# Patient Record
Sex: Female | Born: 2000
Health system: Southern US, Community
[De-identification: ages and names within clinical notes are randomized; demographics above are authoritative.]

## PROBLEM LIST (undated history)

## (undated) DIAGNOSIS — Z8489 Family history of other specified conditions: Secondary | ICD-10-CM

## (undated) DIAGNOSIS — J353 Hypertrophy of tonsils with hypertrophy of adenoids: Secondary | ICD-10-CM

## (undated) HISTORY — PX: NO PAST SURGERIES: SHX2092

---
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
## 2020-05-20 ENCOUNTER — Other Ambulatory Visit: Payer: Self-pay

## 2020-05-20 ENCOUNTER — Telehealth: Payer: Self-pay | Admitting: *Deleted

## 2020-05-20 ENCOUNTER — Telehealth (INDEPENDENT_AMBULATORY_CARE_PROVIDER_SITE_OTHER): Payer: 59 | Admitting: Family Medicine

## 2020-05-20 ENCOUNTER — Encounter: Payer: Self-pay | Admitting: Family Medicine

## 2020-05-20 DIAGNOSIS — J029 Acute pharyngitis, unspecified: Secondary | ICD-10-CM

## 2020-05-20 DIAGNOSIS — J040 Acute laryngitis: Secondary | ICD-10-CM

## 2020-05-20 NOTE — Progress Notes (Signed)
   Patient ID: Brandy [name], female    DOB: [birth year], 19 y.o.   MRN: [id number]   Virtual Visit via Video Note  I connected with Brandy [name] on 05/20/20 at  4:10 PM EDT by a video enabled telemedicine application and verified that I am speaking with the correct person using two identifiers.  Location: Patient: home ([place], [place]) Provider: office   I discussed the limitations of evaluation and management by telemedicine and the availability of in person appointments. The patient expressed understanding and agreed to proceed.  Chief Complaint  Patient presents with  . Sore Throat    for 3 days   Subjective:    HPI  Having throat pain/hoarseness. Had both covid vaccines- early May. Pt is away at school in [place], [place] at [employer] and calling from there for this visit. Works at [employer].  Sore throat for past 3 days. Had lost her voice the first few days. No runny nose, congestion, coughing, ear pain, sinus pain or fever.   No red or white spots on tonsils.  Hasn't been sick for a while. No contact with known covid contacts. Hasn't taken any otc meds for this. Pt had strep in past, and doesn't think this is strep throat feeling.   Medical History Brandy [name] has a past medical history of Family history of adverse reaction to anesthesia and Tonsillar and adenoid hypertrophy (11/2018).   Outpatient Encounter Medications as of 05/20/2020  Medication Sig  . metFORMIN (GLUCOPHAGE XR) 500 MG 24 hr tablet Take one tablet po daily (Patient not taking: Reported on 05/20/2020)  . metFORMIN (GLUCOPHAGE) 500 MG tablet 1 qd (Patient not taking: Reported on 05/20/2020)  . topiramate (TOPAMAX) 50 MG tablet One tablet at bedtime (Patient not taking: Reported on 05/20/2020)   No facility-administered encounter medications on file as of 05/20/2020.     Review of Systems  Constitutional: Negative for chills and fever.  HENT: Positive for sore throat and voice change. Negative for  congestion, ear pain, rhinorrhea, sinus pressure and sinus pain.   Eyes: Negative for pain, discharge and itching.  Respiratory: Negative for cough.   Gastrointestinal: Negative for constipation, diarrhea, nausea and vomiting.    Vitals There were no vitals taken for this visit.  Objective:   Physical Exam  No PE due to phone visit.  Assessment and Plan   1. Laryngitis  2. Pharyngitis, unspecified etiology   Discussed viral uri vs. Bacterial sore throat. Pt is out of town at college and unable to come to clinic for throat swab.  Denies symptoms of strep throat or fever. Advising to try symptomatic tx.  Advising salt water gargles, chloraseptic spray, tylenol/ibuprofen prn. Call office if not improving in next 3-5 days.   Pt in agreement.  F/u prn.   Follow Up Instructions:    I discussed the assessment and treatment plan with the patient. The patient was provided an opportunity to ask questions and all were answered. The patient agreed with the plan and demonstrated an understanding of the instructions.   The patient was advised to call back or seek an in-person evaluation if the symptoms worsen or if the condition fails to improve as anticipated.  I provided 15 minutes of non-face-to-face time during this encounter.

## 2020-05-20 NOTE — Telephone Encounter (Signed)
Ms. Brandy [name], Brandy [name] are scheduled for a virtual visit with your provider today.    Just as we do with appointments in the office, we must obtain your consent to participate.  Your consent will be active for this visit and any virtual visit you may have with one of our providers in the next 365 days.    If you have a MyChart account, I can also send a copy of this consent to you electronically.  All virtual visits are billed to your insurance company just like a traditional visit in the office.  As this is a virtual visit, video technology does not allow for your provider to perform a traditional examination.  This may limit your provider's ability to fully assess your condition.  If your provider identifies any concerns that need to be evaluated in person or the need to arrange testing such as labs, EKG, etc, we will make arrangements to do so.    Although advances in technology are sophisticated, we cannot ensure that it will always work on either your end or our end.  If the connection with a video visit is poor, we may have to switch to a telephone visit.  With either a video or telephone visit, we are not always able to ensure that we have a secure connection.   I need to obtain your verbal consent now.   Are you willing to proceed with your visit today?   Brandy [name] has provided verbal consent on 05/20/2020 for a virtual visit (video or telephone).   Kathleen Lime, RN 05/20/2020  2:27 PM

## 2020-07-30 ENCOUNTER — Other Ambulatory Visit: Payer: Self-pay

## 2020-07-30 ENCOUNTER — Ambulatory Visit: Payer: 59 | Admitting: Nurse Practitioner

## 2020-07-30 VITALS — BP 122/72 | Temp 97.8°F | Wt 199.8 lb

## 2020-07-30 DIAGNOSIS — F329 Major depressive disorder, single episode, unspecified: Secondary | ICD-10-CM

## 2020-07-30 DIAGNOSIS — E282 Polycystic ovarian syndrome: Secondary | ICD-10-CM

## 2020-07-30 DIAGNOSIS — R7302 Impaired glucose tolerance (oral): Secondary | ICD-10-CM | POA: Diagnosis not present

## 2020-07-30 DIAGNOSIS — F32A Depression, unspecified: Secondary | ICD-10-CM

## 2020-07-30 DIAGNOSIS — F419 Anxiety disorder, unspecified: Secondary | ICD-10-CM | POA: Diagnosis not present

## 2020-07-30 LAB — POCT GLYCOSYLATED HEMOGLOBIN (HGB A1C): Hemoglobin A1C: 5.1 % (ref 4.0–5.6)

## 2020-07-30 MED ORDER — ESCITALOPRAM OXALATE 10 MG PO TABS: 10.0000 mg | ORAL_TABLET | Freq: Every day | ORAL | 0 refills | Status: DC

## 2020-07-30 NOTE — Progress Notes (Signed)
   Subjective:    Patient ID: Brandy [name], female    DOB: [birth year], 19 y.o.   MRN: [id number]  HPI  Patient arrives for a follow up on medications. Patient states she stopped her Glucophage due to GI side effects. Stopped Topamax due to changes is taste, particularly carbonated sodas.  Experiencing increased stress which has caused anxiety and depression symptoms.  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 Active lifestyle. Lives in [place] where she goes to college. Had her IUD removed. Back on Depo Provera since March. Followed by [facility].  Currently on Doxycycline 100 mg daily per dermatology. Depression screen [facility] 2/9 07/30/2020 08/26/2018  Decreased Interest 3 1  Down, Depressed, Hopeless 3 1  PHQ - 2 Score 6 2  Altered sleeping 3 3  Tired, decreased energy 3 3  Change in appetite 3 1  Feeling bad or failure about yourself  2 2  Trouble concentrating 1 1  Moving slowly or fidgety/restless 0 0  Suicidal thoughts 0 0  PHQ-9 Score 18 12  Difficult doing work/chores Very difficult Somewhat difficult   GAD 7 : Generalized Anxiety Score 07/30/2020  Nervous, Anxious, on Edge 3  Control/stop worrying 3  Worry too much - different things 3  Trouble relaxing 3  Restless 1  Easily annoyed or irritable 3  Afraid - awful might happen 3  Total GAD 7 Score 19  Anxiety Difficulty Very difficult    Denies suicidal or homicidal thoughts or ideation.     Review of Systems     Objective:   Physical Exam NAD. Alert, oriented. Lungs clear. Heart RRR.  Today's Vitals   07/30/20 1337  BP: 122/72  Temp: 97.8 F (36.6 C)  TempSrc: Oral  Weight: 199 lb 12.8 oz (90.6 kg)   Body mass index is 41.76 kg/m.  Results for orders placed or performed in visit on 07/30/20  POCT glycosylated hemoglobin (Hb A1C)  Result Value Ref Range   Hemoglobin A1C 5.1 4.0 - 5.6 %   HbA1c POC (<> result, manual entry)      HbA1c, POC (prediabetic range)     HbA1c, POC (controlled diabetic range)         Assessment & Plan:   Problem List Items Addressed This Visit      Endocrine   PCOS (polycystic ovarian syndrome) - Primary   Relevant Orders   POCT glycosylated hemoglobin (Hb A1C) (Completed)     Other   Anxiety and depression   Relevant Medications   escitalopram (LEXAPRO) 10 MG tablet   Morbid obesity (HCC)    Other Visit Diagnoses    Impaired glucose tolerance       Relevant Orders   POCT glycosylated hemoglobin (Hb A1C) (Completed)     Meds ordered this encounter  Medications  . escitalopram (LEXAPRO) 10 MG tablet    Sig: Take 1 tablet (10 mg total) by mouth daily.    Dispense:  30 tablet    Refill:  0    Order Specific Question:   Supervising Provider    Answer:   Lilyan Punt A [[id number]]   Start Lexapro as directed. Reviewed potential side effects. DC med and contact office if any problems.  Discussed importance of healthy diet and weight loss related to PCOS.  Return in about 1 month (around 08/30/2020) for anxiety/stress follow up; virtual is fine.

## 2020-07-31 ENCOUNTER — Encounter: Payer: Self-pay | Admitting: Nurse Practitioner

## 2020-08-19 DIAGNOSIS — Z20822 Contact with and (suspected) exposure to covid-19: Secondary | ICD-10-CM | POA: Diagnosis not present

## 2020-08-19 DIAGNOSIS — G43909 Migraine, unspecified, not intractable, without status migrainosus: Secondary | ICD-10-CM | POA: Diagnosis not present

## 2020-08-21 ENCOUNTER — Other Ambulatory Visit: Payer: Self-pay | Admitting: Nurse Practitioner

## 2020-08-30 DIAGNOSIS — Z3042 Encounter for surveillance of injectable contraceptive: Secondary | ICD-10-CM | POA: Diagnosis not present

## 2020-09-03 ENCOUNTER — Telehealth (INDEPENDENT_AMBULATORY_CARE_PROVIDER_SITE_OTHER): Payer: 59 | Admitting: Nurse Practitioner

## 2020-09-03 ENCOUNTER — Encounter: Payer: Self-pay | Admitting: Nurse Practitioner

## 2020-09-03 DIAGNOSIS — F329 Major depressive disorder, single episode, unspecified: Secondary | ICD-10-CM | POA: Diagnosis not present

## 2020-09-03 DIAGNOSIS — F32A Depression, unspecified: Secondary | ICD-10-CM

## 2020-09-03 DIAGNOSIS — F419 Anxiety disorder, unspecified: Secondary | ICD-10-CM | POA: Diagnosis not present

## 2020-09-03 DIAGNOSIS — G43009 Migraine without aura, not intractable, without status migrainosus: Secondary | ICD-10-CM

## 2020-09-03 MED ORDER — SERTRALINE HCL 50 MG PO TABS: ORAL_TABLET | ORAL | 0 refills | Status: DC

## 2020-09-03 NOTE — Progress Notes (Signed)
Patient ID: Brandy [name], female    DOB: 12-Aug-2001, 19 y.o.   MRN: [id number]  HPIFollow up on anxiety and depression. Having some side effects on lexapro.   Virtual Visit via Video Note  I connected with Brandy [name] on 09/03/20 at  1:40 PM EDT by a video enabled telemedicine application and verified that I am speaking with the correct person using two identifiers.  Location: Patient: home Provider: office   I discussed the limitations of evaluation and management by telemedicine and the availability of in person appointments. The patient expressed understanding and agreed to proceed.  History of Present Illness: She is complaining of headaches and migraines. She previously took Topamax and had stopped since her migraines were well controlled. Since starting Lexapro, she experienced increased frequency in headache occurrence. September 2nd, pt had a migraine that was unable to be controlled with OTC medications. She was taken to the ER by a friend where they treated her with the migraine cocktail. Patient stopped taking Lexapro 2 days ago, and has not experienced any headaches since. No change in her migraine symptomatology just more intense and would not go away while on Lexapro.    Subjective:: Review of Systems  Cardiovascular: Negative for chest pain and palpitations.  Neurological: Positive for headaches. Negative for syncope and light-headedness.       Headaches and migraines started when she began taking Lexapro. Had a migraine on Sept. 2nd resulting in an ER visit.   Psychiatric/Behavioral: Negative for agitation, confusion, decreased concentration, dysphoric mood, self-injury, sleep disturbance and suicidal ideas. The patient is not hyperactive.        Patient reports Lexapro working well for her, outside of headaches.       Objective:   Physical Exam Constitutional:      Appearance: Normal appearance. She is well-groomed.  Neurological:     Mental Status: She is  alert.  Psychiatric:        Attention and Perception: Attention normal.        Mood and Affect: Mood and affect normal.        Speech: Speech normal.        Behavior: Behavior normal.        Thought Content: Thought content normal.     Assessment and Plan:  Problem List Items Addressed This Visit      Cardiovascular and Mediastinum   Migraine without aura and without status migrainosus, not intractable - Primary   Relevant Medications   sertraline (ZOLOFT) 50 MG tablet     Other   Anxiety and depression   Relevant Medications   sertraline (ZOLOFT) 50 MG tablet      Informed patient to stop the lexapro and start taking Zoloft. Reviewed potential adverse effects including worsening anxiety, depression or suicidal thoughts.  Informed patient that if she experiences increased frequency or intensity in headaches or any adverse effects to stop taking Zoloft and notify the provider.   Meds ordered this encounter  Medications  . sertraline (ZOLOFT) 50 MG tablet    Sig: Take 1/2 tab PO QD x 6 days, then 1 tab PO QD    Dispense:  30 tablet    Refill:  0    Order Specific Question:   Supervising Provider    Answer:   Lilyan Punt A [[id number]]    Follow Up Instructions: I discussed the assessment and treatment plan with the patient. The patient was provided an opportunity to ask questions and all were answered.  The patient agreed with the plan and demonstrated an understanding of the instructions. The patient was advised to call back or seek an in-person evaluation if the symptoms worsen or if the condition fails to improve as anticipated Return in about 1 month (around 10/03/2020) for depression/anxiety. Call back sooner if needed.  .  I provided 15 minutes of non-face-to-face time during this encounter.

## 2020-09-04 ENCOUNTER — Encounter: Payer: Self-pay | Admitting: Nurse Practitioner

## 2020-09-04 DIAGNOSIS — G43009 Migraine without aura, not intractable, without status migrainosus: Secondary | ICD-10-CM | POA: Insufficient documentation

## 2020-09-04 NOTE — Progress Notes (Signed)
   Subjective:    Patient ID: Brandy [name], female    DOB: [birth year], 19 y.o.   MRN: [id number]  HPI    Review of Systems     Objective:   Physical Exam        Assessment & Plan:

## 2020-09-13 ENCOUNTER — Encounter: Payer: Self-pay | Admitting: Nurse Practitioner

## 2020-09-13 NOTE — Telephone Encounter (Signed)
Nurses So in these type of situations it can often take a good 21 days to see some improvement with antidepressants.  Counseling is extremely important to help with anxiety and depression.  We highly recommended.  If the patient does not have counseling already please help set her up for this.  If she is tolerating the medicine well and not having any significant side effects with the medicine we can increase the dose of the Zoloft to 100 mg 1 daily if the patient is interested in doing so  Also the patient should have a follow-up office visit within 2 to 3 weeks this can be done with myself or with Dorena Bodo nurse practitioner or with Carolyn-please go ahead and work on setting this up as well

## 2020-09-13 NOTE — Telephone Encounter (Signed)
Nurses I would recommend Zoloft 100 mg 1 daily, #30, 2 refills  I also recommend a phone or video follow-up in 2 to 3 weeks to see how things are going-this can be with myself with Clydie Braun or with [name] Also recommend that she notify us should things get worse specifically if she should start feeling suicidal or severely depressed she would need to help right away through urgent care or ER or here thank you

## 2020-09-14 MED ORDER — SERTRALINE HCL 100 MG PO TABS: 100.0000 mg | ORAL_TABLET | Freq: Every day | ORAL | 2 refills | Status: DC

## 2020-09-14 NOTE — Addendum Note (Signed)
Addended by: Marlowe Shores on: 09/14/2020 09:14 AM   Modules accepted: Orders

## 2020-09-28 ENCOUNTER — Other Ambulatory Visit: Payer: Self-pay | Admitting: Nurse Practitioner

## 2020-10-01 ENCOUNTER — Ambulatory Visit (INDEPENDENT_AMBULATORY_CARE_PROVIDER_SITE_OTHER): Payer: 59 | Admitting: Nurse Practitioner

## 2020-10-01 ENCOUNTER — Encounter: Payer: Self-pay | Admitting: Nurse Practitioner

## 2020-10-01 VITALS — BP 114/72 | Temp 97.6°F | Wt 201.2 lb

## 2020-10-01 DIAGNOSIS — G43009 Migraine without aura, not intractable, without status migrainosus: Secondary | ICD-10-CM | POA: Diagnosis not present

## 2020-10-01 DIAGNOSIS — F419 Anxiety disorder, unspecified: Secondary | ICD-10-CM | POA: Diagnosis not present

## 2020-10-01 DIAGNOSIS — F32A Depression, unspecified: Secondary | ICD-10-CM | POA: Diagnosis not present

## 2020-10-01 MED ORDER — VENLAFAXINE HCL ER 37.5 MG PO CP24: ORAL_CAPSULE | ORAL | 0 refills | Status: DC

## 2020-10-01 NOTE — Patient Instructions (Addendum)
Take 50 mg of the Zoloft for a couple days, then start taking the Effexor. Do not stop abruptly, this can cause increased agitation or mood fluctuation. If symptoms do not improve or worsen, please let us know.

## 2020-10-01 NOTE — Progress Notes (Signed)
Subjective:    Patient ID: Brandy [name], female    DOB: 10/25/2001, 19 y.o.   MRN: [id number]  HPI Pt here for medication follow up. Pt is on Zoloft 100 mg but states its not working. Pt is having migraine every day. Since started anxiety/depression med, she has noticed migraines daily.  Stopped taking Topamax, Headaches were improved until recently trying antidepressants. This is her second medication she has tried. Anxiety and depression have worsened with new dose.   Review of Systems  Constitutional: Positive for activity change, appetite change and fatigue. Negative for chills, diaphoresis and fever.       She has been staying in bed, missing class, and struggles to go to work.   Respiratory: Positive for chest tightness. Negative for shortness of breath.   Cardiovascular: Negative for chest pain and palpitations.  Neurological: Positive for light-headedness and headaches. Negative for syncope and weakness.       Headaches were improved until recently trying antidepressants. This is her second medication she has tried. HA's are worse than before, described as a constant pounding pain.  Psychiatric/Behavioral: Positive for agitation, decreased concentration, dysphoric mood and sleep disturbance. Negative for self-injury and suicidal ideas. The patient is nervous/anxious. The patient is not hyperactive.    Depression screen [facility] 2/9 10/01/2020 09/03/2020 07/30/2020 08/26/2018  Decreased Interest 3 3 3 1   Down, Depressed, Hopeless 3 2 3 1   PHQ - 2 Score 6 5 6 2   Altered sleeping 3 3 3 3   Tired, decreased energy 3 3 3 3   Change in appetite 3 3 3 1   Feeling bad or failure about yourself  3 3 2 2   Trouble concentrating 2 1 1 1   Moving slowly or fidgety/restless 1 0 0 0  Suicidal thoughts 0 0 0 0  PHQ-9 Score 21 18 18 12   Difficult doing work/chores Extremely dIfficult Somewhat difficult Very difficult Somewhat difficult        GAD 7 : Generalized Anxiety Score 10/01/2020 09/03/2020  07/30/2020  Nervous, Anxious, on Edge 3 3 3   Control/stop worrying 3 3 3   Worry too much - different things 3 3 3   Trouble relaxing 3 1 3   Restless 2 0 1  Easily annoyed or irritable 3 3 3   Afraid - awful might happen 3 0 3  Total GAD 7 Score 20 13 19   Anxiety Difficulty Extremely difficult Somewhat difficult Very difficult     Objective:   Physical Exam Constitutional:      Appearance: She is well-developed and well-groomed.     Comments: Pt is wearing sunglasses due to constant headaches/migraines.  Cardiovascular:     Rate and Rhythm: Normal rate and regular rhythm.     Pulses: Normal pulses.     Heart sounds: Normal heart sounds. No murmur heard.   Pulmonary:     Effort: Pulmonary effort is normal.     Breath sounds: Normal breath sounds. No wheezing.  Neurological:     Mental Status: She is alert.  Psychiatric:        Attention and Perception: Attention normal.        Mood and Affect: Mood normal.        Speech: Speech normal.        Behavior: Behavior normal. Behavior is cooperative.        Thought Content: Thought content normal. Thought content does not include homicidal or suicidal ideation. Thought content does not include homicidal or suicidal plan.  Judgment: Judgment normal.   Making good eye contact. Cheerful, calm affect. Dressed appropriately.  Today's Vitals   10/01/20 1503  BP: 114/72  Temp: 97.6 F (36.4 C)  Weight: 91.3 kg   Body mass index is 42.05 kg/m.     Assessment & Plan:   Problem List Items Addressed This Visit      Cardiovascular and Mediastinum   Migraine without aura and without status migrainosus, not intractable   Relevant Medications   venlafaxine XR (EFFEXOR XR) 37.5 MG 24 hr capsule     Other   Anxiety and depression - Primary   Relevant Medications   venlafaxine XR (EFFEXOR XR) 37.5 MG 24 hr capsule      Meds ordered this encounter  Medications  . venlafaxine XR (EFFEXOR XR) 37.5 MG 24 hr capsule    Sig: Take  1 capsule PO QD for depression and anxiety    Dispense:  30 capsule    Refill:  0    Take 50 mg of the Zoloft for a couple days, DC then start taking the Effexor. Do not stop abruptly, this can cause increased agitation or mood fluctuation.  BBW: If you experience thoughts of harming yourself or others stop the medication and seek immediate medical attention. If symptoms do not improve or worsen with new medication, please let us know. Defers psychiatry or counseling referral at this time. Has access to students health at [employer].  Effexor prescribed to help mental health issues and migraines.   Return in about 1 month (around 11/01/2020) for follow up on new med.Marland Kitchen

## 2020-10-03 ENCOUNTER — Encounter: Payer: Self-pay | Admitting: Nurse Practitioner

## 2020-10-03 ENCOUNTER — Encounter: Payer: Self-pay | Admitting: Family Medicine

## 2020-10-03 NOTE — Progress Notes (Signed)
   Subjective:    Patient ID: Brandy [name], female    DOB: 01/04/2001, 19 y.o.   MRN: [id number]  HPI    Review of Systems     Objective:   Physical Exam        Assessment & Plan:   

## 2020-10-04 ENCOUNTER — Telehealth: Payer: Self-pay

## 2020-10-04 NOTE — Telephone Encounter (Signed)
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  It could be a mild virus.  Less likely to be food poisoning. Covid can sometimes cause these type of symptoms, it would be wise for the patient to be seen.  If she needs anything else from [place] please let me know thank you-Dr. Lorin Picket

## 2020-10-05 ENCOUNTER — Telehealth (INDEPENDENT_AMBULATORY_CARE_PROVIDER_SITE_OTHER): Payer: 59 | Admitting: Family Medicine

## 2020-10-05 ENCOUNTER — Encounter: Payer: Self-pay | Admitting: Family Medicine

## 2020-10-05 ENCOUNTER — Other Ambulatory Visit: Payer: Self-pay

## 2020-10-05 DIAGNOSIS — R197 Diarrhea, unspecified: Secondary | ICD-10-CM | POA: Diagnosis not present

## 2020-10-05 MED ORDER — DIPHENOXYLATE-ATROPINE 2.5-0.025 MG PO TABS: 1.0000 | ORAL_TABLET | Freq: Four times a day (QID) | ORAL | 0 refills | Status: DC | PRN

## 2020-10-05 NOTE — Progress Notes (Signed)
   Patient ID: Brandy [name], female    DOB: [birth year], 19 y.o.   MRN: [id number]   Chief Complaint  Patient presents with  . Diarrhea    Patient reports having severe diarrhea and vomitting on Saturday. Has not vomitted since Sunday but is still having diarrhea frequently and unable to eat much but trying to drink plenty of fluids.    Subjective:  CC: diarrhea since Saturday  Brandy [name] is seen today via video visit.  Her symptoms started Saturday night at her sister's rehearsal dinner.  She was experiencing nausea, vomiting, and diarrhea.  She is also experienced a slight headache since then.  Pertinent negatives include no fever, no nausea since Sunday, no vomiting since Sunday, no congestion, no sore throat.  Today she is still experiencing liquid diarrhea, 6 times today.  She is able to keep water down without vomiting, and is alternating water and Gatorade.  Wishes to control the diarrhea.    Medical History Brandy [name] has a past medical history of Family history of adverse reaction to anesthesia and Tonsillar and adenoid hypertrophy (11/2018).   Outpatient Encounter Medications as of 10/05/2020  Medication Sig  . diphenoxylate-atropine (LOMOTIL) 2.5-0.025 MG tablet Take 1 tablet by mouth 4 (four) times daily as needed for diarrhea or loose stools.  . venlafaxine XR (EFFEXOR XR) 37.5 MG 24 hr capsule Take 1 capsule PO QD for depression and anxiety  . [DISCONTINUED] DOXYCYCLINE PO Take by mouth. Takes for acne   No facility-administered encounter medications on file as of 10/05/2020.     Review of Systems  Constitutional: Negative for chills and fever.  HENT: Negative for congestion.   Gastrointestinal: Positive for diarrhea, nausea and vomiting. Negative for abdominal pain.       No nausea and vomiting since Sunday.      Vitals There were no vitals taken for this visit.  Objective:   Physical Exam Vitals reviewed: unable to due video visit.    Unable due to video  visit  Assessment and Plan   1. Diarrhea of presumed infectious origin - diphenoxylate-atropine (LOMOTIL) 2.5-0.025 MG tablet; Take 1 tablet by mouth 4 (four) times daily as needed for diarrhea or loose stools.  Dispense: 15 tablet; Refill: 0   This is likely viral in nature.  Goal is to prevent dehydration, and the need to go to the emergency department for IV fluids.  She is able to keep fluids down, alternating water and Gatorade.  No fever, no nausea, and vomiting since Sunday.  Her symptoms have improved somewhat, except for the diarrhea.  Lomotil sent to pharmacy.  She will continue conservative treatment, paying close attention to her hydration status.  She attends school at [employer], is unable to travel back to [place] due to her symptoms, school note sent through my chart.  Agrees with plan of care discussed today. Understands warning signs to seek further care: Fever, nausea, vomiting,, diarrhea not improving with medication, worsening symptoms. Understands to follow-up if symptoms do not improve.

## 2020-10-08 NOTE — Telephone Encounter (Signed)
ERROR

## 2020-10-28 ENCOUNTER — Other Ambulatory Visit: Payer: Self-pay | Admitting: Nurse Practitioner

## 2020-11-17 ENCOUNTER — Encounter: Payer: Self-pay | Admitting: Family Medicine

## 2020-11-19 ENCOUNTER — Other Ambulatory Visit: Payer: Self-pay

## 2020-11-19 ENCOUNTER — Telehealth (INDEPENDENT_AMBULATORY_CARE_PROVIDER_SITE_OTHER): Payer: 59 | Admitting: Nurse Practitioner

## 2020-11-19 ENCOUNTER — Telehealth: Payer: Self-pay | Admitting: *Deleted

## 2020-11-19 DIAGNOSIS — F32A Depression, unspecified: Secondary | ICD-10-CM

## 2020-11-19 DIAGNOSIS — F419 Anxiety disorder, unspecified: Secondary | ICD-10-CM | POA: Diagnosis not present

## 2020-11-19 DIAGNOSIS — G43009 Migraine without aura, not intractable, without status migrainosus: Secondary | ICD-10-CM | POA: Diagnosis not present

## 2020-11-19 DIAGNOSIS — Z3042 Encounter for surveillance of injectable contraceptive: Secondary | ICD-10-CM | POA: Diagnosis not present

## 2020-11-19 MED ORDER — RIZATRIPTAN BENZOATE 10 MG PO TABS: 10.0000 mg | ORAL_TABLET | ORAL | 0 refills | Status: DC | PRN

## 2020-11-19 MED ORDER — VENLAFAXINE HCL 75 MG PO TABS: 75.0000 mg | ORAL_TABLET | Freq: Every day | ORAL | 0 refills | Status: DC

## 2020-11-19 NOTE — Progress Notes (Signed)
  PHONE VISIT  Patient ID: Brandy [name], female    DOB: [birth year], 19 y.o.   MRN: [id number]  Virtual Visit via Phone:  I connected with Brandy [name] on 11/19/20 at  3:40 PM EST by a phone and verified that I am speaking with the correct person using two identifiers.  Location: Patient: home Provider: office   I discussed the limitations of evaluation and management by telemedicine and the availability of in person appointments. The patient expressed understanding and agreed to proceed.  History of Present Illness: Patient arrives to discuss return of migraines.   Subjective: Patient states migraine started Tuesday with real bad headache and chills- sweating- no fever. Patient states she had stiffness in her neck and headache continued through Thursday. The chills and sweating are gone today but still has the headache and head feels heavy. Overall, the headache is less severe than before. She also reports having back pain throughout her middle to low back. Denies any urinary symtoms such as urgency, frequency, or hematuria.   Pt reports taking her temperature with the highest temp being 98.7. She denies getting any Covid testing done.    She reports stopping the Effexor 37.5 mg and that it was not helpful. No adverse effects. Her symptoms did not get worse, but explains that they were not improved with the new medication. Denies thoughts of hurting herself or others.   Observations/Objective: General: Patient is alert with coherent thought and judgement Exam limited due to telephone visit.   Assessment and Plan: Problem List Items Addressed This Visit      Cardiovascular and Mediastinum   Migraine without aura and without status migrainosus, not intractable     Other   Anxiety and depression - Primary     Encouraged patient to obtain a Covid test. Expect continued gradual resolution of her symptoms. If she experiences symptoms such as fever, worsening headache, stiff neck,  blurred vision, nausea/ vomiting or urinary symptoms to seek immediate medical attention.  Increase the Effexor dose to 75 mg PO daily. If worsening symptoms or thoughts of hurting yourself for others occur, stop the medicine and seek immediate medical attention.   Begin Maxalt for abortive migraine therapy. Informed the patient of instructions and of possible side effects of this medication. Do not exceed 2 tablets in a 24 hour period. Avoid overuse. Recommend keeping a headache diary through a free app.   Contact the office if you have and question or concerns regarding your new medication.   Follow Up Instructions:  Return in about 1 month (around 12/20/2020).   I discussed the assessment and treatment plan with the patient. The patient was provided an opportunity to ask questions and all were answered. The patient agreed with the plan and demonstrated an understanding of the instructions.   The patient was advised to call back or seek an in-person evaluation if the symptoms worsen or if the condition fails to improve as anticipated.  I provided 15 minutes of non-face-to-face time during this encounter.

## 2020-11-19 NOTE — Telephone Encounter (Signed)
[name], sawatzky are scheduled for a virtual visit with your provider today.    Just as we do with appointments in the office, we must obtain your consent to participate.  Your consent will be active for this visit and any virtual visit you may have with one of our providers in the next 365 days.    If you have a MyChart account, I can also send a copy of this consent to you electronically.  All virtual visits are billed to your insurance company just like a traditional visit in the office.  As this is a virtual visit, video technology does not allow for your provider to perform a traditional examination.  This may limit your provider's ability to fully assess your condition.  If your provider identifies any concerns that need to be evaluated in person or the need to arrange testing such as labs, EKG, etc, we will make arrangements to do so.    Although advances in technology are sophisticated, we cannot ensure that it will always work on either your end or our end.  If the connection with a video visit is poor, we may have to switch to a telephone visit.  With either a video or telephone visit, we are not always able to ensure that we have a secure connection.   I need to obtain your verbal consent now.   Are you willing to proceed with your visit today?   Brandy [name] has provided verbal consent on 11/19/2020 for a virtual visit (video or telephone).   Kathleen Lime, RN 11/19/2020  1:29 PM

## 2020-11-19 NOTE — Patient Instructions (Signed)
With the Maxalt, you may feel some tightness in the chest. This is a common side effect. Do not take this more than twice in a 24 hour period.

## 2020-11-20 ENCOUNTER — Encounter: Payer: Self-pay | Admitting: Nurse Practitioner

## 2020-11-20 NOTE — Progress Notes (Signed)
   Subjective:    Patient ID: Brandy [name], female    DOB: Feb 16, 2001, 19 y.o.   MRN: [id number]  HPI    Review of Systems     Objective:   Physical Exam        Assessment & Plan:

## 2020-12-09 ENCOUNTER — Other Ambulatory Visit: Payer: Self-pay | Admitting: Nurse Practitioner

## 2020-12-09 NOTE — Telephone Encounter (Signed)
Seen 12/3 for migraines

## 2020-12-12 ENCOUNTER — Other Ambulatory Visit: Payer: Self-pay | Admitting: Nurse Practitioner

## 2020-12-13 NOTE — Telephone Encounter (Signed)
Seen 10/01/20 for depression

## 2021-02-07 DIAGNOSIS — Z3042 Encounter for surveillance of injectable contraceptive: Secondary | ICD-10-CM | POA: Diagnosis not present

## 2021-03-23 ENCOUNTER — Telehealth: Payer: Self-pay

## 2021-03-23 NOTE — Telephone Encounter (Signed)
Pt wants a refill on dicyclomine (Bentyl) 10 mg pt wants this sent to [facility] [address]  Pt call back [phone]

## 2021-03-25 ENCOUNTER — Other Ambulatory Visit: Payer: Self-pay | Admitting: Nurse Practitioner

## 2021-03-25 MED ORDER — DICYCLOMINE HCL 10 MG PO CAPS: 10.0000 mg | ORAL_CAPSULE | Freq: Three times a day (TID) | ORAL | 2 refills | Status: AC

## 2021-03-25 NOTE — Telephone Encounter (Signed)
Done

## 2021-04-25 DIAGNOSIS — Z01419 Encounter for gynecological examination (general) (routine) without abnormal findings: Secondary | ICD-10-CM | POA: Diagnosis not present

## 2021-04-25 DIAGNOSIS — Z3042 Encounter for surveillance of injectable contraceptive: Secondary | ICD-10-CM | POA: Diagnosis not present

## 2021-04-25 DIAGNOSIS — E282 Polycystic ovarian syndrome: Secondary | ICD-10-CM | POA: Diagnosis not present

## 2021-04-25 DIAGNOSIS — Z3202 Encounter for pregnancy test, result negative: Secondary | ICD-10-CM | POA: Diagnosis not present

## 2021-06-10 DIAGNOSIS — Z0100 Encounter for examination of eyes and vision without abnormal findings: Secondary | ICD-10-CM | POA: Diagnosis not present

## 2021-07-13 DIAGNOSIS — Z3042 Encounter for surveillance of injectable contraceptive: Secondary | ICD-10-CM | POA: Diagnosis not present

## 2021-08-02 DIAGNOSIS — F411 Generalized anxiety disorder: Secondary | ICD-10-CM | POA: Diagnosis not present

## 2021-08-02 DIAGNOSIS — F341 Dysthymic disorder: Secondary | ICD-10-CM | POA: Diagnosis not present

## 2021-08-02 DIAGNOSIS — F332 Major depressive disorder, recurrent severe without psychotic features: Secondary | ICD-10-CM | POA: Diagnosis not present

## 2021-08-16 DIAGNOSIS — F332 Major depressive disorder, recurrent severe without psychotic features: Secondary | ICD-10-CM | POA: Diagnosis not present

## 2021-08-16 DIAGNOSIS — F411 Generalized anxiety disorder: Secondary | ICD-10-CM | POA: Diagnosis not present

## 2021-08-16 DIAGNOSIS — F341 Dysthymic disorder: Secondary | ICD-10-CM | POA: Diagnosis not present

## 2021-08-17 DIAGNOSIS — N390 Urinary tract infection, site not specified: Secondary | ICD-10-CM | POA: Diagnosis not present

## 2021-08-17 DIAGNOSIS — N3000 Acute cystitis without hematuria: Secondary | ICD-10-CM | POA: Diagnosis not present

## 2021-09-15 ENCOUNTER — Other Ambulatory Visit (HOSPITAL_COMMUNITY): Payer: Self-pay

## 2021-09-15 MED ORDER — BUPROPION HCL ER (XL) 150 MG PO TB24
150.0000 mg | ORAL_TABLET | Freq: Every morning | ORAL | 0 refills | Status: DC
  Filled 2021-09-15 – 2021-09-19 (×3): qty 30, 30d supply, fill #0

## 2021-09-19 ENCOUNTER — Other Ambulatory Visit (HOSPITAL_COMMUNITY): Payer: Self-pay

## 2021-09-20 DIAGNOSIS — F411 Generalized anxiety disorder: Secondary | ICD-10-CM | POA: Diagnosis not present

## 2021-09-20 DIAGNOSIS — F341 Dysthymic disorder: Secondary | ICD-10-CM | POA: Diagnosis not present

## 2021-09-20 DIAGNOSIS — F332 Major depressive disorder, recurrent severe without psychotic features: Secondary | ICD-10-CM | POA: Diagnosis not present

## 2021-09-27 DIAGNOSIS — Z3042 Encounter for surveillance of injectable contraceptive: Secondary | ICD-10-CM | POA: Diagnosis not present

## 2021-10-21 ENCOUNTER — Other Ambulatory Visit (HOSPITAL_COMMUNITY): Payer: Self-pay

## 2021-10-21 DIAGNOSIS — F341 Dysthymic disorder: Secondary | ICD-10-CM | POA: Diagnosis not present

## 2021-10-21 DIAGNOSIS — F332 Major depressive disorder, recurrent severe without psychotic features: Secondary | ICD-10-CM | POA: Diagnosis not present

## 2021-10-21 DIAGNOSIS — F411 Generalized anxiety disorder: Secondary | ICD-10-CM | POA: Diagnosis not present

## 2021-10-21 MED ORDER — SERTRALINE HCL 50 MG PO TABS
ORAL_TABLET | ORAL | 0 refills | Status: DC
  Filled 2021-10-21: qty 30, 30d supply, fill #0

## 2021-11-21 ENCOUNTER — Other Ambulatory Visit (HOSPITAL_COMMUNITY): Payer: Self-pay

## 2021-11-21 DIAGNOSIS — F341 Dysthymic disorder: Secondary | ICD-10-CM | POA: Diagnosis not present

## 2021-11-21 DIAGNOSIS — F411 Generalized anxiety disorder: Secondary | ICD-10-CM | POA: Diagnosis not present

## 2021-11-21 DIAGNOSIS — F332 Major depressive disorder, recurrent severe without psychotic features: Secondary | ICD-10-CM | POA: Diagnosis not present

## 2021-11-21 MED ORDER — HYDROXYZINE PAMOATE 25 MG PO CAPS
ORAL_CAPSULE | ORAL | 0 refills | Status: AC
  Filled 2021-11-21: qty 120, 30d supply, fill #0

## 2021-11-21 MED ORDER — SERTRALINE HCL 100 MG PO TABS
100.0000 mg | ORAL_TABLET | Freq: Every day | ORAL | 0 refills | Status: DC
  Filled 2021-11-21: qty 90, 90d supply, fill #0

## 2021-11-22 ENCOUNTER — Other Ambulatory Visit (HOSPITAL_COMMUNITY): Payer: Self-pay

## 2021-12-20 DIAGNOSIS — Z3042 Encounter for surveillance of injectable contraceptive: Secondary | ICD-10-CM | POA: Diagnosis not present

## 2021-12-20 DIAGNOSIS — F332 Major depressive disorder, recurrent severe without psychotic features: Secondary | ICD-10-CM | POA: Diagnosis not present

## 2021-12-20 DIAGNOSIS — F411 Generalized anxiety disorder: Secondary | ICD-10-CM | POA: Diagnosis not present

## 2021-12-20 DIAGNOSIS — F341 Dysthymic disorder: Secondary | ICD-10-CM | POA: Diagnosis not present

## 2022-02-03 ENCOUNTER — Ambulatory Visit (INDEPENDENT_AMBULATORY_CARE_PROVIDER_SITE_OTHER): Payer: 59 | Admitting: Nurse Practitioner

## 2022-02-03 ENCOUNTER — Other Ambulatory Visit: Payer: Self-pay

## 2022-02-03 ENCOUNTER — Other Ambulatory Visit (HOSPITAL_COMMUNITY): Payer: Self-pay

## 2022-02-03 ENCOUNTER — Encounter: Payer: Self-pay | Admitting: Nurse Practitioner

## 2022-02-03 ENCOUNTER — Ambulatory Visit (HOSPITAL_COMMUNITY)
Admission: RE | Admit: 2022-02-03 | Discharge: 2022-02-03 | Disposition: A | Discharge status: Home / Self Care | Payer: 59 | Source: Ambulatory Visit | Attending: Nurse Practitioner | Admitting: Nurse Practitioner

## 2022-02-03 ENCOUNTER — Telehealth: Payer: Self-pay | Admitting: Family Medicine

## 2022-02-03 VITALS — BP 123/86 | HR 99 | Temp 98.1°F | Ht <= 58 in | Wt 211.0 lb

## 2022-02-03 DIAGNOSIS — B9689 Other specified bacterial agents as the cause of diseases classified elsewhere: Secondary | ICD-10-CM

## 2022-02-03 DIAGNOSIS — E282 Polycystic ovarian syndrome: Secondary | ICD-10-CM

## 2022-02-03 DIAGNOSIS — G8929 Other chronic pain: Secondary | ICD-10-CM

## 2022-02-03 DIAGNOSIS — M545 Low back pain, unspecified: Secondary | ICD-10-CM | POA: Diagnosis not present

## 2022-02-03 DIAGNOSIS — J019 Acute sinusitis, unspecified: Secondary | ICD-10-CM

## 2022-02-03 MED ORDER — WEGOVY 0.25 MG/0.5ML ~~LOC~~ SOAJ
0.2500 mg | SUBCUTANEOUS | 0 refills | Status: DC
  Filled 2022-02-03 (×3): qty 2, 28d supply, fill #0

## 2022-02-03 MED ORDER — TIZANIDINE HCL 4 MG PO TABS: 4.0000 mg | ORAL_TABLET | Freq: Four times a day (QID) | ORAL | 0 refills | Status: AC | PRN

## 2022-02-03 MED ORDER — AZITHROMYCIN 250 MG PO TABS: ORAL_TABLET | ORAL | 0 refills | Status: DC

## 2022-02-03 MED ORDER — NAPROXEN 500 MG PO TABS: 500.0000 mg | ORAL_TABLET | Freq: Two times a day (BID) | ORAL | 0 refills | Status: DC

## 2022-02-03 NOTE — Progress Notes (Signed)
Subjective:    Patient ID: Brandy [name], female    DOB: [birth year], 21 y.o.   MRN: [id number]  HPI Lower Back pain chronic issue worsening over the years States lordosis was noted on a previous x-ray.  Has occurred off and on but has worsened lately which she relates to weight gain.  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  Gets massages every month for the past 2 years which will give her 5 to 6 days of relief and then pain will gradually come back.  Describes the pain is along the mid back area bilaterally with occasional shooting pain up to the mid back area near her bra line for the past month.  No change in bowel or bladder habits.  Pain does not radiate down her extremities.  No history of injury. Would like to discuss options to help her with weight.  Tried healthy diet and going to the gym on a regular basis but got discouraged and has not doing very well lately.  Plans to restart this but would like medication to help jumpstart her weight loss.  Denies any personal history of pancreatitis or cancer.  Denies any family history of thyroid or other endocrine cancers.  Was given metformin for her PCOS but unable to tolerate this due to persistent significant diarrhea. Has a history of anxiety and depression has an appointment in April in [place] where she lives to hopefully start counseling/therapy.  Denies suicidal homicidal thoughts or ideation. Also complaints of sinus symptoms over the past week.  Occasional cough.  Scratchy throat.  No fever.  Off-and-on ear pain.  Symptoms have worsened over the past couple of days.  Now producing yellow mucus all day.  Feels like it is going more into the upper chest area.  Sinus pressure.  No wheezing, chest pain or shortness of breath.  Depression screen PHQ 2/9 02/03/2022  Decreased Interest 2  Down, Depressed, Hopeless 2  PHQ - 2 Score 4  Altered sleeping 3  Tired, decreased energy 3  Change in appetite 2   Feeling bad or failure about yourself  1  Trouble concentrating 2  Moving slowly or fidgety/restless 0  Suicidal thoughts 0  PHQ-9 Score 15  Difficult doing work/chores Very difficult         Objective:   Physical Exam NAD.  Alert, oriented.  TMs mild clear effusion, no erythema.  Nasal mucosa pale and mildly boggy more on the left.  Pharynx injected with slightly discolored PND noted.  Neck supple with mild soft anterior adenopathy.  Lungs clear.  Heart regular rate rhythm.  Tenderness with palpation of the lumbar spine.  Generalized mild tenderness along the bilateral lumbar paraspinal area.  SLR negative bilateral.  Patellar reflexes normal.  Gait normal.  Normal ROM of the spine with minimal tenderness. Lumbar spine x-ray dated 06/23/2010 normal except for accentuated lumbar lordosis. Today's Vitals   02/03/22 0931  BP: 123/86  Pulse: 99  Temp: 98.1 F (36.7 C)  SpO2: 98%  Weight: 211 lb (95.7 kg)  Height: 4\' 10"  (1.473 m)   Body mass index is 44.1 kg/m.       Assessment & Plan:   Problem List Items Addressed This Visit       Endocrine   PCOS (polycystic ovarian syndrome)     Other   Morbid obesity ([place])   Other Visit Diagnoses     Chronic bilateral low back pain without sciatica    -  Primary   Relevant Medications   naproxen (NAPROSYN) 500 MG tablet   tiZANidine (ZANAFLEX) 4 MG tablet   Other Relevant Orders   DG Lumbar Spine 2-3 Views (Completed)   Acute bacterial rhinosinusitis       Relevant Medications   azithromycin (ZITHROMAX Z-PAK) 250 MG tablet       Meds ordered this encounter  Medications   naproxen (NAPROSYN) 500 MG tablet    Sig: Take 1 tablet (500 mg total) by mouth 2 (two) times daily with a meal. Prn back pain    Dispense:  30 tablet    Refill:  0    Order Specific Question:   Supervising Provider    Answer:   Sallee Lange A [[id number]]   tiZANidine (ZANAFLEX) 4 MG tablet    Sig: Take 1 tablet (4 mg total) by mouth every 6 (six) hours  as needed for muscle spasms.    Dispense:  30 tablet    Refill:  0    Order Specific Question:   Supervising Provider    Answer:   Sallee Lange A [[id number]]   azithromycin (ZITHROMAX Z-PAK) 250 MG tablet    Sig: Take 2 tablets (500 mg) on  Day 1,  followed by 1 tablet (250 mg) once daily on Days 2 through 5.    Dispense:  6 each    Refill:  0    Order Specific Question:   Supervising Provider    Answer:   Sallee Lange A [[id number]]   DISCONTD: Semaglutide-Weight Management (WEGOVY) 0.25 MG/0.5ML SOAJ    Sig: Inject 0.25 mg into the skin once a week.    Dispense:  2 mL    Refill:  0    Order Specific Question:   Supervising Provider    Answer:   Sallee Lange A [[id number]]   Recommend TENS unit, ice/heat applications, stretching exercises and continued massage therapy.  Naproxen as directed as needed pain discontinue if any reflux or abdominal pain.  Tizanidine as directed for muscle spasms, cautioned about potential drowsiness.  X-ray pending.  Explained to patient that previous x-ray may have been slightly abnormal due to muscle spasms. Z-Pak as directed.  OTC meds as directed for sinus symptoms and cough.  Start Wegovy 0.25 mg weekly as directed.  Reviewed potential risk and adverse effects.  Discontinue medication and contact office if any problems.  Patient to restart her healthy diet and regular activity as planned. Follow-up with therapist as planned.  Contact office sooner if needed. Recheck in 1 month, call back sooner if needed.

## 2022-02-03 NOTE — Telephone Encounter (Signed)
Patient was able to find the weight loss medication at [facility] however she states that they need a prior auth.  CB# [phone]  Medication: Semaglutide-Weight Management (WEGOVY) 0.25 MG/0.5ML SOAJ

## 2022-02-04 ENCOUNTER — Encounter: Payer: Self-pay | Admitting: Nurse Practitioner

## 2022-02-04 ENCOUNTER — Other Ambulatory Visit: Payer: Self-pay | Admitting: Nurse Practitioner

## 2022-02-04 MED ORDER — WEGOVY 0.25 MG/0.5ML ~~LOC~~ SOAJ
0.2500 mg | SUBCUTANEOUS | 0 refills | Status: DC
Start: 2022-02-04 — End: 2022-02-12
  Filled 2022-02-04 – 2022-02-10 (×4): qty 2, 28d supply, fill #0

## 2022-02-04 NOTE — Telephone Encounter (Signed)
Order sent to [facility]

## 2022-02-06 ENCOUNTER — Telehealth: Payer: Self-pay

## 2022-02-06 ENCOUNTER — Encounter: Payer: Self-pay | Admitting: Nurse Practitioner

## 2022-02-06 ENCOUNTER — Other Ambulatory Visit (HOSPITAL_COMMUNITY): Payer: Self-pay

## 2022-02-06 NOTE — Telephone Encounter (Signed)
Pt called in asking if prior authorization for this med Semaglutide-Weight Management (WEGOVY) 0.25 MG/0.5ML SOAJ could be sent to her personal insurance [employer]. Please advise.  Cb#: [phone]

## 2022-02-06 NOTE — Telephone Encounter (Signed)
Patient sent My Chart message to [facility] NP requesting cheaper alternative medication.

## 2022-02-06 NOTE — Telephone Encounter (Addendum)
Patient states her UMR would only cover a few hundred dollars of script due to high deductible and wants [place] to see if [employer] will pay more. Patient will have pharmacy run thru to generate the PA request

## 2022-02-10 ENCOUNTER — Other Ambulatory Visit (HOSPITAL_COMMUNITY): Payer: Self-pay

## 2022-02-12 ENCOUNTER — Other Ambulatory Visit: Payer: Self-pay | Admitting: Nurse Practitioner

## 2022-02-12 MED ORDER — PHENTERMINE HCL 37.5 MG PO TABS: ORAL_TABLET | ORAL | 0 refills | Status: DC

## 2022-02-15 ENCOUNTER — Other Ambulatory Visit (HOSPITAL_COMMUNITY): Payer: Self-pay

## 2022-03-09 DIAGNOSIS — Z3042 Encounter for surveillance of injectable contraceptive: Secondary | ICD-10-CM | POA: Diagnosis not present

## 2022-03-10 ENCOUNTER — Telehealth: Payer: 59 | Admitting: Nurse Practitioner

## 2022-03-10 ENCOUNTER — Telehealth: Payer: Self-pay

## 2022-03-10 ENCOUNTER — Encounter: Payer: Self-pay | Admitting: Nurse Practitioner

## 2022-03-10 ENCOUNTER — Other Ambulatory Visit: Payer: Self-pay

## 2022-03-10 ENCOUNTER — Telehealth (INDEPENDENT_AMBULATORY_CARE_PROVIDER_SITE_OTHER): Payer: 59 | Admitting: Nurse Practitioner

## 2022-03-10 VITALS — Wt 195.6 lb

## 2022-03-10 DIAGNOSIS — E282 Polycystic ovarian syndrome: Secondary | ICD-10-CM | POA: Diagnosis not present

## 2022-03-10 DIAGNOSIS — F32A Depression, unspecified: Secondary | ICD-10-CM

## 2022-03-10 DIAGNOSIS — F419 Anxiety disorder, unspecified: Secondary | ICD-10-CM | POA: Diagnosis not present

## 2022-03-10 MED ORDER — PHENTERMINE HCL 37.5 MG PO TABS: ORAL_TABLET | ORAL | 2 refills | Status: DC

## 2022-03-10 NOTE — Progress Notes (Signed)
Patient ID: Brandy [name], female   DOB: [birth year], 20 y.o.   MRN: [id number] ?Virtual Visit via Telephone Note ? ?I connected with SHELTON SOLER on 03/10/22 at 10:40 AM EDT by telephone and verified that I am speaking with the correct person using two identifiers. ? ?Location: ?Patient: home ?Provider: office ?  ?I discussed the limitations, risks, security and privacy concerns of performing an evaluation and management service by telephone and the availability of in person appointments. I also discussed with the patient that there may be a patient responsible charge related to this service. The patient expressed understanding and agreed to proceed. ? ? ?History of Present Illness: ?Presents by phone for recheck after starting phentermine at last visit a month ago.  Doing well.  States her heart rate is stayed normal.  No palpitations.  Sleeping well.  Has made some major lifestyle changes including stopping all sweet tea which she was drinking all the time before.  Now drinking mainly water.  Eats green super foods, coffee, or probiotic yogurt in the morning for breakfast.  Has a high-protein, or snack during the day.  Takes a daily vitamin.  With the weight loss and massage therapy her back is much improved.  Has been more active taking stairs and walking more.  Has had an occasional slight vaginal bleeding x3 with tiny clots small amount. ?Emotionally doing better, has her first therapy appointment scheduled for 4/5.  Also note worsening symptoms have resolved. ?  ?Observations/Objective: ?Today's visit was via telephone ?Physical exam was not possible for this visit ?Alert, oriented.  Cheerful affect.  ?Reports weight is of great 195.6 pounds. ? ?Assessment and Plan: ?Problem List Items Addressed This Visit   ? ?  ? Endocrine  ? PCOS (polycystic ovarian syndrome) - Primary  ?  ? Other  ? Anxiety and depression  ? Morbid obesity (HCC)  ? Relevant Medications  ? phentermine (ADIPEX-P) 37.5 MG tablet  ? ?Meds  ordered this encounter  ?Medications  ? phentermine (ADIPEX-P) 37.5 MG tablet  ?  Sig: Take one tab po qam on an empty stomach  ?  Dispense:  30 tablet  ?  Refill:  2  ?  Order Specific Question:   Supervising Provider  ?  Answer:   Lilyan Punt A [[id number]]  ? ?Continue healthy habits including diet low in sugar and regular activity. ?Continue phentermine for 3 more months and if she wishes to continue schedule a visit virtual or phone is fine since she lives out of town. ?Start therapy as planned.   ?Call back if any heavy or prolonged bleeding. ?Return in about 3 months (around 06/10/2022) for virtual or phone is fine. ? ? ?  ?I discussed the assessment and treatment plan with the patient. The patient was provided an opportunity to ask questions and all were answered. The patient agreed with the plan and demonstrated an understanding of the instructions. ?  ?The patient was advised to call back or seek an in-person evaluation if the symptoms worsen or if the condition fails to improve as anticipated. ? ?I provided 15 minutes of non-face-to-face time during this encounter. ? ? ?Campbell Riches, NP ? ?

## 2022-03-10 NOTE — Telephone Encounter (Signed)
I connected with  Brandy [name] on 03/10/22 by a video enabled telemedicine application and verified that I am speaking with the correct person using two identifiers. ?  ?I discussed the limitations of evaluation and management by telemedicine. The patient expressed understanding and agreed to proceed.  ? ? ?

## 2022-03-10 NOTE — Progress Notes (Deleted)
? ?  Subjective:  ? ? Patient ID: Brandy [name], female    DOB: [birth year], 20 y.o.   MRN: [id number] ? ?HPI ?Follow up for weight loss medication phentermine , no adverse effects reported  ?Next next dose and refills  ? ? ?Review of Systems ? ?   ?Objective:  ? Physical Exam ?Virtual Visit via Video Note ? ?I connected with Brandy [name] on 03/10/22 at 10:40 AM EDT by a video enabled telemedicine application and verified that I am speaking with the correct person using two identifiers. ? ?Location: ?Patient: home ?Provider: office ?  ?I discussed the limitations of evaluation and management by telemedicine and the availability of in person appointments. The patient expressed understanding and agreed to proceed. ? ?History of Present Illness: ? ?  ?Observations/Objective: ? ? ?Assessment and Plan: ? ? ?Follow Up Instructions: ? ?  ?I discussed the assessment and treatment plan with the patient. The patient was provided an opportunity to ask questions and all were answered. The patient agreed with the plan and demonstrated an understanding of the instructions. ?  ?The patient was advised to call back or seek an in-person evaluation if the symptoms worsen or if the condition fails to improve as anticipated. ? ?I provided *** minutes of non-face-to-face time during this encounter. ? ? ?Sherie Don NP ? ? ? ?   ?Assessment & Plan:  ? ? ?

## 2022-03-11 ENCOUNTER — Encounter: Payer: Self-pay | Admitting: Nurse Practitioner

## 2022-03-22 DIAGNOSIS — F329 Major depressive disorder, single episode, unspecified: Secondary | ICD-10-CM | POA: Diagnosis not present

## 2022-03-22 DIAGNOSIS — Z6839 Body mass index (BMI) 39.0-39.9, adult: Secondary | ICD-10-CM | POA: Diagnosis not present

## 2022-03-22 DIAGNOSIS — E669 Obesity, unspecified: Secondary | ICD-10-CM | POA: Diagnosis not present

## 2022-03-22 DIAGNOSIS — F411 Generalized anxiety disorder: Secondary | ICD-10-CM | POA: Diagnosis not present

## 2022-05-31 DIAGNOSIS — F418 Other specified anxiety disorders: Secondary | ICD-10-CM | POA: Diagnosis not present

## 2022-05-31 DIAGNOSIS — Z3042 Encounter for surveillance of injectable contraceptive: Secondary | ICD-10-CM | POA: Diagnosis not present

## 2022-05-31 DIAGNOSIS — Z6835 Body mass index (BMI) 35.0-35.9, adult: Secondary | ICD-10-CM | POA: Diagnosis not present

## 2022-05-31 DIAGNOSIS — Z3202 Encounter for pregnancy test, result negative: Secondary | ICD-10-CM | POA: Diagnosis not present

## 2022-05-31 DIAGNOSIS — E282 Polycystic ovarian syndrome: Secondary | ICD-10-CM | POA: Diagnosis not present

## 2022-05-31 DIAGNOSIS — Z01411 Encounter for gynecological examination (general) (routine) with abnormal findings: Secondary | ICD-10-CM | POA: Diagnosis not present

## 2022-06-09 ENCOUNTER — Encounter: Payer: Self-pay | Admitting: Nurse Practitioner

## 2022-06-09 ENCOUNTER — Ambulatory Visit (INDEPENDENT_AMBULATORY_CARE_PROVIDER_SITE_OTHER): Payer: 59 | Admitting: Nurse Practitioner

## 2022-06-09 VITALS — BP 107/75 | Wt 171.2 lb

## 2022-06-09 DIAGNOSIS — E282 Polycystic ovarian syndrome: Secondary | ICD-10-CM | POA: Diagnosis not present

## 2022-06-10 ENCOUNTER — Encounter: Payer: Self-pay | Admitting: Nurse Practitioner

## 2022-06-10 DIAGNOSIS — S51832A Puncture wound without foreign body of left forearm, initial encounter: Secondary | ICD-10-CM | POA: Diagnosis not present

## 2022-06-10 DIAGNOSIS — W540XXA Bitten by dog, initial encounter: Secondary | ICD-10-CM | POA: Diagnosis not present

## 2022-06-10 MED ORDER — PHENTERMINE HCL 37.5 MG PO TABS: ORAL_TABLET | ORAL | 1 refills | Status: DC

## 2022-06-12 ENCOUNTER — Encounter: Payer: Self-pay | Admitting: Family Medicine

## 2022-06-12 ENCOUNTER — Telehealth: Payer: Self-pay | Admitting: *Deleted

## 2022-06-12 ENCOUNTER — Other Ambulatory Visit: Payer: Self-pay | Admitting: Family Medicine

## 2022-06-12 MED ORDER — PHENTERMINE HCL 37.5 MG PO TABS: ORAL_TABLET | ORAL | 1 refills | Status: DC

## 2022-06-13 ENCOUNTER — Encounter: Payer: Self-pay | Admitting: Family Medicine

## 2022-06-13 ENCOUNTER — Ambulatory Visit (INDEPENDENT_AMBULATORY_CARE_PROVIDER_SITE_OTHER): Payer: 59 | Admitting: Family Medicine

## 2022-06-13 ENCOUNTER — Other Ambulatory Visit (HOSPITAL_COMMUNITY)
Admission: RE | Admit: 2022-06-13 | Discharge: 2022-06-13 | Disposition: A | Discharge status: Home / Self Care | Payer: 59 | Attending: Family Medicine | Admitting: Family Medicine

## 2022-06-13 VITALS — BP 128/72 | Temp 98.2°F | Wt 172.6 lb

## 2022-06-13 DIAGNOSIS — Y939 Activity, unspecified: Secondary | ICD-10-CM | POA: Insufficient documentation

## 2022-06-13 DIAGNOSIS — S51852A Open bite of left forearm, initial encounter: Secondary | ICD-10-CM | POA: Insufficient documentation

## 2022-06-13 DIAGNOSIS — L089 Local infection of the skin and subcutaneous tissue, unspecified: Secondary | ICD-10-CM | POA: Insufficient documentation

## 2022-06-13 DIAGNOSIS — W540XXA Bitten by dog, initial encounter: Secondary | ICD-10-CM

## 2022-06-13 LAB — CBC WITH DIFFERENTIAL/PLATELET
Abs Immature Granulocytes: 0.02 10*3/uL (ref 0.00–0.07)
Basophils Absolute: 0 10*3/uL (ref 0.0–0.1)
Basophils Relative: 1 %
Eosinophils Absolute: 0.1 10*3/uL (ref 0.0–0.5)
Eosinophils Relative: 1 %
HCT: 42.7 % (ref 36.0–46.0)
Hemoglobin: 14.1 g/dL (ref 12.0–15.0)
Immature Granulocytes: 0 %
Lymphocytes Relative: 22 %
Lymphs Abs: 1.4 10*3/uL (ref 0.7–4.0)
MCH: 27.1 pg (ref 26.0–34.0)
MCHC: 33 g/dL (ref 30.0–36.0)
MCV: 82 fL (ref 80.0–100.0)
Monocytes Absolute: 0.6 10*3/uL (ref 0.1–1.0)
Monocytes Relative: 9 %
Neutro Abs: 4.2 10*3/uL (ref 1.7–7.7)
Neutrophils Relative %: 67 %
Platelets: 230 10*3/uL (ref 150–400)
RBC: 5.21 MIL/uL — ABNORMAL HIGH (ref 3.87–5.11)
RDW: 14.1 % (ref 11.5–15.5)
WBC: 6.3 10*3/uL (ref 4.0–10.5)
nRBC: 0 % (ref 0.0–0.2)

## 2022-06-13 MED ORDER — TRAMADOL HCL 50 MG PO TABS: 50.0000 mg | ORAL_TABLET | Freq: Three times a day (TID) | ORAL | 0 refills | Status: AC | PRN

## 2022-06-13 NOTE — Assessment & Plan Note (Signed)
CBC today.  Advised elevation and continued use of Augmentin.  Tramadol as needed for pain. If does not improve, will need to go to the hospital for IV antibiotics.

## 2022-06-14 ENCOUNTER — Emergency Department (HOSPITAL_COMMUNITY)
Admission: EM | Admit: 2022-06-14 | Discharge: 2022-06-14 | Disposition: A | Discharge status: Home / Self Care | Payer: 59 | Attending: Emergency Medicine | Admitting: Emergency Medicine

## 2022-06-14 ENCOUNTER — Encounter: Payer: Self-pay | Admitting: Family Medicine

## 2022-06-14 ENCOUNTER — Other Ambulatory Visit: Payer: Self-pay

## 2022-06-14 ENCOUNTER — Encounter (HOSPITAL_COMMUNITY): Payer: Self-pay | Admitting: Emergency Medicine

## 2022-06-14 ENCOUNTER — Emergency Department (HOSPITAL_COMMUNITY): Payer: 59

## 2022-06-14 DIAGNOSIS — W540XXD Bitten by dog, subsequent encounter: Secondary | ICD-10-CM | POA: Insufficient documentation

## 2022-06-14 DIAGNOSIS — S51852D Open bite of left forearm, subsequent encounter: Secondary | ICD-10-CM | POA: Insufficient documentation

## 2022-06-14 DIAGNOSIS — R600 Localized edema: Secondary | ICD-10-CM | POA: Diagnosis not present

## 2022-06-14 DIAGNOSIS — R000 Tachycardia, unspecified: Secondary | ICD-10-CM | POA: Diagnosis not present

## 2022-06-14 LAB — CBC WITH DIFFERENTIAL/PLATELET
Abs Immature Granulocytes: 0.04 10*3/uL (ref 0.00–0.07)
Basophils Absolute: 0 10*3/uL (ref 0.0–0.1)
Basophils Relative: 1 %
Eosinophils Absolute: 0.1 10*3/uL (ref 0.0–0.5)
Eosinophils Relative: 1 %
HCT: 42 % (ref 36.0–46.0)
Hemoglobin: 13.6 g/dL (ref 12.0–15.0)
Immature Granulocytes: 1 %
Lymphocytes Relative: 17 %
Lymphs Abs: 1.2 10*3/uL (ref 0.7–4.0)
MCH: 27 pg (ref 26.0–34.0)
MCHC: 32.4 g/dL (ref 30.0–36.0)
MCV: 83.5 fL (ref 80.0–100.0)
Monocytes Absolute: 0.5 10*3/uL (ref 0.1–1.0)
Monocytes Relative: 6 %
Neutro Abs: 5.6 10*3/uL (ref 1.7–7.7)
Neutrophils Relative %: 74 %
Platelets: 237 10*3/uL (ref 150–400)
RBC: 5.03 MIL/uL (ref 3.87–5.11)
RDW: 14.1 % (ref 11.5–15.5)
WBC: 7.4 10*3/uL (ref 4.0–10.5)
nRBC: 0 % (ref 0.0–0.2)

## 2022-06-14 LAB — BASIC METABOLIC PANEL
Anion gap: 8 (ref 5–15)
BUN: 12 mg/dL (ref 6–20)
CO2: 21 mmol/L — ABNORMAL LOW (ref 22–32)
Calcium: 8.7 mg/dL — ABNORMAL LOW (ref 8.9–10.3)
Chloride: 108 mmol/L (ref 98–111)
Creatinine, Ser: 0.82 mg/dL (ref 0.44–1.00)
GFR, Estimated: >60 mL/min (ref >60)
Glucose, Bld: 98 mg/dL (ref 70–99)
Potassium: 3.6 mmol/L (ref 3.5–5.1)
Sodium: 137 mmol/L (ref 135–145)

## 2022-06-14 MED ORDER — SODIUM CHLORIDE 0.9 % IV SOLN
3.0000 g | Freq: Once | INTRAVENOUS | Status: AC
Start: 2022-06-14 — End: 2022-06-14
  Administered 2022-06-14: 3 g via INTRAVENOUS
  Filled 2022-06-14: qty 8

## 2022-06-14 NOTE — ED Triage Notes (Signed)
Pt was bit by dog on Saturday, seen at PCP on Tuesday given ABTs, infection spreading up left forearm.

## 2022-06-14 NOTE — Discharge Instructions (Signed)
Continue to clean the wound with mild antibacterial soap and water.  Keep the areas bandaged.  Continue your Augmentin as directed until it is finished.  Elevate your arm when possible.  Follow-up with your primary care provider for recheck.  Return to the emergency department for any new or worsening symptoms.

## 2022-06-16 NOTE — ED Provider Notes (Signed)
[facility] EMERGENCY DEPARTMENT Provider Note   CSN: [id number] Arrival date & time: 06/14/22  1426     History  Chief Complaint  Patient presents with   Animal Bite    Brandy [name] is a 21 y.o. female.   Animal Bite Associated symptoms: no fever and no numbness         Brandy [name] is a 21 y.o. female who presents to the Emergency Department requesting reevaluation of dog bite of the left forearm.  Bite occurred 4 days ago.  She was initially treated for her wounds at urgent care.  She states the areas were cleaned and she was prescribed Augmentin which she is taking twice daily.  She continues to have pain and redness of her forearm area with some drainage of the puncture wound to the dorsal forearm.  She notes increasing redness.  She was also seen by her PCP for same.  No change in treatment but referred to the ER for possible IV antibiotics.  Bitten by her own dog.  States the dog is up-to-date on rabies vaccinations and she is up-to-date on tetanus.     Home Medications Prior to Admission medications   Medication Sig Start Date End Date Taking? Authorizing Provider  amoxicillin-clavulanate (AUGMENTIN) 500-125 MG tablet Take 1 tablet by mouth 2 (two) times daily. 06/10/22   [provider]  dicyclomine (BENTYL) 10 MG capsule Take 1 capsule (10 mg total) by mouth 4 (four) times daily -  before meals and at bedtime. Prn 03/25/21   Nilda Simmer, NP  hydrOXYzine (VISTARIL) 25 MG capsule Take 1 - 2 capsules by mouth 1 - 2 times a day as needed for anxiety/ sleep 11/21/21     medroxyPROGESTERone (DEPO-PROVERA) 150 MG/ML injection Inject 150 mg into the muscle every 3 (three) months.    [provider]  phentermine (ADIPEX-P) 37.5 MG tablet Take one tab po qam on an empty stomach 06/12/22   Kathyrn Drown, MD  PROZAC 20 MG capsule  05/31/22   [provider]  rizatriptan (MAXALT) 10 MG tablet TAKE 1 TABLET (10 MG TOTAL) BY MOUTH AS NEEDED FOR  MIGRAINE. MAY REPEAT IN 2 HOURS IF NEEDED MAX 2 TABLETS PER 24 HOURS 12/09/20   Luking, Scott A, MD  tiZANidine (ZANAFLEX) 4 MG tablet Take 1 tablet (4 mg total) by mouth every 6 (six) hours as needed for muscle spasms. 02/03/22   Nilda Simmer, NP  traMADol (ULTRAM) 50 MG tablet Take 1 tablet (50 mg total) by mouth every 8 (eight) hours as needed for up to 5 days for severe pain or moderate pain. 06/13/22 06/18/22  Coral Spikes, DO      Allergies    Amethia [levonorgestrel-ethinyl estrad], Bactrim [sulfamethoxazole-trimethoprim], Lexapro [escitalopram], and Zoloft [sertraline]    Review of Systems   Review of Systems  Constitutional:  Negative for chills and fever.  Gastrointestinal:  Negative for nausea and vomiting.  Skin:  Positive for color change and wound.       Dog bite left forearm  Neurological:  Negative for dizziness, weakness and numbness.    Physical Exam Updated Vital Signs BP 115/85 (BP Location: Right Arm)   Pulse (!) 112   Temp 98.2 F (36.8 C)   Resp 15   Ht 4\' 11"  (1.499 m)   Wt 75.3 kg   SpO2 100%   BMI 33.53 kg/m  Physical Exam Vitals and nursing note reviewed.  Constitutional:  General: She is not in acute distress.    Appearance: Normal appearance. She is not toxic-appearing.  Cardiovascular:     Rate and Rhythm: Regular rhythm. Tachycardia present.     Pulses: Normal pulses.  Pulmonary:     Effort: Pulmonary effort is normal.     Breath sounds: Normal breath sounds.  Musculoskeletal:        General: Tenderness and signs of injury present.     Comments: Patient with 2 puncture wounds of the mid left forearm.  Puncture wound of the ventral aspect of the forearm appears to be healing well.  There is some mild surrounding erythema.  Puncture wound to the dorsal aspect of the forearm with some mild purulent drainage.  Erythema of the forearm is nearly circumferential, but proximal to the wound sites.  No lymphangitis.  Compartments are soft.  Elbow  and wrist nontender.  5 out of 5 grip strength bilateral upper extremities.  Lymphadenopathy:     Cervical: No cervical adenopathy.  Skin:    General: Skin is warm.     Capillary Refill: Capillary refill takes less than 2 seconds.     Findings: Erythema present.  Neurological:     General: No focal deficit present.     Mental Status: She is alert.     Sensory: No sensory deficit.     Motor: No weakness.     ED Results / Procedures / Treatments   Labs (all labs ordered are listed, but only abnormal results are displayed) Labs Reviewed  BASIC METABOLIC PANEL - Abnormal; Notable for the following components:      Result Value   CO2 21 (*)    Calcium 8.7 (*)    All other components within normal limits  CBC WITH DIFFERENTIAL/PLATELET    EKG None  Radiology DG Forearm Left  Result Date: 06/14/2022 CLINICAL DATA:  Dog bite EXAM: LEFT FOREARM - 2 VIEW COMPARISON:  None Available. FINDINGS: No fracture or malalignment. No periostitis or osseous destructive change. Moderate soft tissue edema within the forearm. No radiopaque foreign body or emphysema IMPRESSION: Soft tissue edema without acute osseous abnormality Electronically Signed   By: Jasmine Pang M.D.   On: 06/14/2022 15:13    Procedures Procedures    Medications Ordered in ED Medications  Ampicillin-Sulbactam (UNASYN) 3 g in sodium chloride 0.9 % 100 mL IVPB (0 g Intravenous Stopped 06/14/22 2140)    ED Course/ Medical Decision Making/ A&P                           Medical Decision Making Patient here for evaluation of 4-day old dog bite of the left forearm.  Wounds were initially cleaned at urgent care and patient was started on Augmentin, she was also seen by PCP without change in treatment plan.  Here for reevaluation and possible IV antibiotics as she is complaining of continued pain, redness of her forearm and drainage to one of the puncture wounds.  Dog is known and up-to-date on rabies vaccinations patient is  up-to-date on her tetanus immunizations  On exam, she does have some localized erythema of her forearm without lymphangitis.  Compartments of the arm are soft she has good cap refill and range of motion of the wrist and elbow.  Some purulent drainage noted when bandage was removed, I cannot express any purulent drainage on exam.  Puncture wound to the palmar aspect of the forearm appears to be healing well.  She is taking the Augmentin twice daily as directed, given redness there may be some component of cellulitis.  Wound will be cleaned, x-rays obtained and labs.  Will start IV Unasyn.  Overall, she is well-appearing nontoxic.  If labs and x-ray reassuring, I feel that she can be discharged home with continuing her Augmentin after receiving IV antibiotic.  Amount and/or Complexity of Data Reviewed Labs: ordered.    Details: Labs interpreted by me, reassuring.  No leukocytosis.  Chemistries unremarkable. Radiology: ordered.    Details: X-ray of the left forearm shows some soft tissue edema without osseous abnormality no emphysema.  I agree with radiology interpretation. Discussion of management or test interpretation with external provider(s): On recheck, patient resting comfortably.  She has received IV Unasyn here.  Leading edge of erythema was marked by me.  She appears appropriate for discharge home, will have her continue her Augmentin as directed.  She will follow-up closely with PCP.  I have given strict return precautions and she is agreeable to plan.           Final Clinical Impression(s) / ED Diagnoses Final diagnoses:  Dog bite, subsequent encounter    Rx / DC Orders ED Discharge Orders     None         Pauline Aus, PA-C 06/16/22 1446    Eber Hong, MD 06/22/22 [phone]

## 2022-06-19 MED ORDER — AMOXICILLIN-POT CLAVULANATE 875-125 MG PO TABS: ORAL_TABLET | ORAL | 0 refills | Status: DC

## 2022-06-19 NOTE — Telephone Encounter (Signed)
Nurses Please touch base with Brandy [name] Please see if she is able to drop by here 3:45 PM I can take a look at her arm may need to prescribe an additional antibiotic then can also advise her regarding the color as well as drainage and whether or not additional intervention is necessary   Hopefully she can come by this afternoon thanks-Dr. Lorin Picket

## 2022-06-19 NOTE — Telephone Encounter (Signed)
Nurses-please extend her Augmentin 875 mg twice daily, #14  Wednesday 3:30 PM please

## 2022-06-21 ENCOUNTER — Ambulatory Visit (INDEPENDENT_AMBULATORY_CARE_PROVIDER_SITE_OTHER): Payer: 59 | Admitting: Family Medicine

## 2022-06-21 ENCOUNTER — Encounter: Payer: Self-pay | Admitting: Family Medicine

## 2022-06-21 VITALS — BP 112/64 | Wt 171.8 lb

## 2022-06-21 DIAGNOSIS — Z23 Encounter for immunization: Secondary | ICD-10-CM | POA: Diagnosis not present

## 2022-06-21 DIAGNOSIS — S51852D Open bite of left forearm, subsequent encounter: Secondary | ICD-10-CM | POA: Diagnosis not present

## 2022-06-21 DIAGNOSIS — W540XXD Bitten by dog, subsequent encounter: Secondary | ICD-10-CM | POA: Diagnosis not present

## 2022-06-21 DIAGNOSIS — R251 Tremor, unspecified: Secondary | ICD-10-CM | POA: Diagnosis not present

## 2022-06-21 DIAGNOSIS — L089 Local infection of the skin and subcutaneous tissue, unspecified: Secondary | ICD-10-CM | POA: Diagnosis not present

## 2022-06-21 MED ORDER — GABAPENTIN 100 MG PO CAPS: ORAL_CAPSULE | ORAL | 4 refills | Status: AC

## 2022-06-21 NOTE — Progress Notes (Signed)
   Subjective:    Patient ID: Brandy [name], female    DOB: [birth year], 20 y.o.   MRN: [id number]  HPI Pt here for follow up on dog bite to left arm. Pt states the redness and swelling have decreased. Pt began to have tremors off and on Monday. Has a pinching feeling from inside of arm. Still having shooting pain. Pt did go to ER last week per Dr.Cook recommendations.   Patient complains of some pain swelling tenderness.  Still draining on the top part nondraining on the bottom part Review of Systems     Objective:   Physical Exam  Some of the color changes are related to recent bruising With the compression [name] than likely this caused a contusion of the nerve causing some intermittent tremors She does need a tetanus shot In addition to this there is some firmness on the volar side of the forearm      Assessment & Plan:  Cellulitis Dog bite Nerve compression Should gradually get better If progressive symptoms over the next 3 to 4 weeks consider possibility of reflex sympathetic dystrophy If ongoing troubles notify us May need referral Finish out antibiotics Tdap today due to dog bite and has been 9+ years since last tetanus Follow-up sooner if any problems

## 2022-08-23 DIAGNOSIS — Z3042 Encounter for surveillance of injectable contraceptive: Secondary | ICD-10-CM | POA: Diagnosis not present

## 2022-09-01 ENCOUNTER — Ambulatory Visit (INDEPENDENT_AMBULATORY_CARE_PROVIDER_SITE_OTHER): Payer: 59 | Admitting: Nurse Practitioner

## 2022-09-01 VITALS — BP 124/79 | Wt 171.4 lb

## 2022-09-01 DIAGNOSIS — F909 Attention-deficit hyperactivity disorder, unspecified type: Secondary | ICD-10-CM | POA: Insufficient documentation

## 2022-09-01 DIAGNOSIS — F32A Depression, unspecified: Secondary | ICD-10-CM | POA: Diagnosis not present

## 2022-09-01 DIAGNOSIS — R69 Illness, unspecified: Secondary | ICD-10-CM | POA: Diagnosis not present

## 2022-09-01 DIAGNOSIS — Z79899 Other long term (current) drug therapy: Secondary | ICD-10-CM

## 2022-09-01 DIAGNOSIS — F419 Anxiety disorder, unspecified: Secondary | ICD-10-CM | POA: Diagnosis not present

## 2022-09-01 MED ORDER — AMPHETAMINE-DEXTROAMPHET ER 10 MG PO CP24: 10.0000 mg | ORAL_CAPSULE | Freq: Every day | ORAL | 0 refills | Status: DC

## 2022-09-01 NOTE — Progress Notes (Unsigned)
Subjective:    Patient ID: Brandy [name], female    DOB: 02-Jun-2001, 20 y.o.   MRN: [id number]  HPI  Patient arrives for a follow up on phentermine- states this was the last month she could be on it so would like to discuss another option. Patient also would like to discuss depression- has been unable to establish care where she lives in [place].  Was seeking counseling but even with her insurance it is very expensive, recently received a large bill.  Currently she is taking 21 hours of college credit courses.  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  Limited time for activity other than a lot of walking that she does on campus since school started back.  Tries to eat as healthy as possible.  Is complaining her last prescription of phentermine.  Denies suicidal or homicidal thoughts or ideation.  Denies any self-harm behaviors.  Gets regular wellness exams, is on Depo-Provera for birth control.  Has completed the HPV vaccine series. States she is having difficulty focusing.  This has been going on long-term.  The phentermine helped somewhat.  Patient would like to address possible ADD. See ASRS screen.  Adult ADHD Self Report Scale (most recent)     Adult ADHD Self-Report Scale (ASRS-v1.1) Symptom Checklist - 09/01/22 1634       Part A   1. How often do you have trouble wrapping up the final details of a project, once the challenging parts have been done? Often  2. How often do you have difficulty getting things done in order when you have to do a task that requires organization? Sometimes    3. How often do you have problems remembering appointments or obligations? Sometimes  4. When you have a task that requires a lot of thought, how often do you avoid or delay getting started? Often    5. How often do you fidget or squirm with your hands or feet when you have to sit down for a long time? Very Often  6. How often do  you feel overly active and compelled to do things, like you were driven by a motor? Very Often      Part B   7. How often do you make careless mistakes when you have to work on a boring or difficult project? Very Often  8. How often do you have difficulty keeping your attention when you are doing boring or repetitive work? Very Often    9. How often do you have difficulty concentrating on what people say to you, even when they are speaking to you directly? Very Often  10. How often do you misplace or have difficulty finding things at home or at work? Sometimes    11. How often are you distracted by activity or noise around you? Often  12. How often do you leave your seat in meetings or other situations in which you are expected to remain seated? Never    13. How often do you feel restless or fidgety? Very Often  14. How often do you have difficulty unwinding and relaxing when you have time to yourself? Very Often    15. How often do you find yourself talking too much when you are in social situations? Often  16. When you are in a conversation, how often do you find yourself finishing the sentences of the people you are talking to, before they can  finish them themselves? Very Often    17. How often do you have difficulty waiting your turn in situations when turn taking is required? Often  18. How often do you interrupt others when they are busy? Rarely      Comment   How old were you when these problems first began to occur? --   Unsure but going on a long time               Review of Systems  Constitutional:  Positive for fatigue.  HENT:  Negative for sore throat and trouble swallowing.   Respiratory:  Negative for chest tightness and shortness of breath.   Cardiovascular:  Negative for chest pain and palpitations.  Psychiatric/Behavioral:  Negative for self-injury and suicidal ideas. The patient is nervous/anxious.        Objective:   Physical Exam NAD.  Alert, oriented.  Speech  clear.  Making good eye contact.  Mildly anxious affect.  Cheerful.  Dressed appropriately for the weather.  Thoughts logical coherent and relevant.  Thyroid nontender to palpation, no mass or goiter noted.  Lungs clear.  Heart regular rate rhythm.  No murmur noted.  EKG normal limit. Today's Vitals   09/01/22 1532  BP: 124/79  Weight: 171 lb 6.4 oz (77.7 kg)   Body mass index is 34.62 kg/m.        Assessment & Plan:   Problem List Items Addressed This Visit       Other   Adult ADHD - Primary   Anxiety and depression   Other Visit Diagnoses     High risk medication use       Relevant Orders   EKG 12-Lead (Completed)      Meds ordered this encounter  Medications   amphetamine-dextroamphetamine (ADDERALL XR) 10 MG 24 hr capsule    Sig: Take 1 capsule (10 mg total) by mouth daily.    Dispense:  30 capsule    Refill:  0    Order Specific Question:   Supervising Provider    Answer:   Lilyan Punt A [[id number]]   Trial of low-dose Adderall XR to see if this will help her focus.  Lengthy explanation regarding anxiety and ADHD.  We will start treating the ADHD first per patient request.  We will need to address her anxiety symptoms next.  Patient to seek help immediately if any suicidal or homicidal thoughts or ideation.  Stop phentermine.  Patient advised to stop her medications if she becomes pregnant.  Has no plans at this time. Recommend that she look at counseling through one of the apps due to lower cost.  She will examine her options. Recommend Pap smear sometime over the next year since she is 21. Follow-up in 1 month, virtual or phone visit is fine.  Call back sooner if needed.

## 2022-09-02 ENCOUNTER — Encounter: Payer: Self-pay | Admitting: Nurse Practitioner

## 2022-09-29 ENCOUNTER — Ambulatory Visit (INDEPENDENT_AMBULATORY_CARE_PROVIDER_SITE_OTHER): Payer: 59 | Admitting: Nurse Practitioner

## 2022-09-29 ENCOUNTER — Encounter: Payer: Self-pay | Admitting: Nurse Practitioner

## 2022-09-29 VITALS — BP 120/74 | Wt 177.6 lb

## 2022-09-29 DIAGNOSIS — F909 Attention-deficit hyperactivity disorder, unspecified type: Secondary | ICD-10-CM | POA: Diagnosis not present

## 2022-09-29 DIAGNOSIS — R69 Illness, unspecified: Secondary | ICD-10-CM | POA: Diagnosis not present

## 2022-09-29 MED ORDER — AMPHETAMINE-DEXTROAMPHET ER 20 MG PO CP24: 20.0000 mg | ORAL_CAPSULE | ORAL | 0 refills | Status: DC

## 2022-09-29 NOTE — Progress Notes (Unsigned)
   Subjective:    Patient ID: Brandy [name], female    DOB: [birth year], 21 y.o.   MRN: [id number]  HPI Pt arrives for follow on medications.  Pt states since coming off Phentermine she can notice issues more.   Review of Systems     Objective:   Physical Exam        Assessment & Plan:

## 2022-09-30 ENCOUNTER — Encounter: Payer: Self-pay | Admitting: Nurse Practitioner

## 2022-11-03 ENCOUNTER — Other Ambulatory Visit: Payer: Self-pay | Admitting: Nurse Practitioner

## 2022-11-04 ENCOUNTER — Encounter: Payer: Self-pay | Admitting: Family Medicine

## 2022-11-04 MED ORDER — AMPHETAMINE-DEXTROAMPHET ER 20 MG PO CP24: 20.0000 mg | ORAL_CAPSULE | ORAL | 0 refills | Status: DC

## 2022-11-13 DIAGNOSIS — Z3042 Encounter for surveillance of injectable contraceptive: Secondary | ICD-10-CM | POA: Diagnosis not present

## 2022-12-26 ENCOUNTER — Other Ambulatory Visit: Payer: Self-pay | Admitting: Family Medicine

## 2022-12-27 NOTE — Telephone Encounter (Signed)
Needs to schedule follow-up visit to see me-or Carolyn-needs to be done within the next 30 days then I will refill the medicine

## 2023-01-02 DIAGNOSIS — F330 Major depressive disorder, recurrent, mild: Secondary | ICD-10-CM | POA: Diagnosis not present

## 2023-01-02 DIAGNOSIS — R69 Illness, unspecified: Secondary | ICD-10-CM | POA: Diagnosis not present

## 2023-01-29 DIAGNOSIS — Z3042 Encounter for surveillance of injectable contraceptive: Secondary | ICD-10-CM | POA: Diagnosis not present

## 2023-02-26 DIAGNOSIS — Z111 Encounter for screening for respiratory tuberculosis: Secondary | ICD-10-CM | POA: Diagnosis not present

## 2023-02-26 DIAGNOSIS — R0981 Nasal congestion: Secondary | ICD-10-CM | POA: Diagnosis not present

## 2023-02-26 DIAGNOSIS — J029 Acute pharyngitis, unspecified: Secondary | ICD-10-CM | POA: Diagnosis not present

## 2023-02-26 DIAGNOSIS — D72829 Elevated white blood cell count, unspecified: Secondary | ICD-10-CM | POA: Diagnosis not present

## 2023-02-28 DIAGNOSIS — Z6838 Body mass index (BMI) 38.0-38.9, adult: Secondary | ICD-10-CM | POA: Diagnosis not present

## 2023-02-28 DIAGNOSIS — H6691 Otitis media, unspecified, right ear: Secondary | ICD-10-CM | POA: Diagnosis not present

## 2023-02-28 DIAGNOSIS — H6121 Impacted cerumen, right ear: Secondary | ICD-10-CM | POA: Diagnosis not present

## 2023-03-15 ENCOUNTER — Ambulatory Visit (INDEPENDENT_AMBULATORY_CARE_PROVIDER_SITE_OTHER): Payer: Commercial Managed Care - PPO | Admitting: Family Medicine

## 2023-03-15 DIAGNOSIS — J301 Allergic rhinitis due to pollen: Secondary | ICD-10-CM

## 2023-03-15 NOTE — Progress Notes (Signed)
   Subjective:    Patient ID: Posey Boyer, female    DOB: 08-14-2001, 22 y.o.   MRN: [id number]  HPI Patient arrives today with sore throat and swollen glands in her neck. She has been on two rounds of antibiotic from urgent care but still having trouble swallowing. Denies fever.     Review of Systems     Objective:   Physical Exam Gen-NAD not toxic TMS-normal bilateral T- normal no redness Chest-CTA respiratory rate normal no crackles CV RRR no murmur Skin-warm dry Neuro-grossly normal        Assessment & Plan:  Viral syndrome Secondary rhinosinusitis No further antibiotics indicated Should gradually get better over the course of the next couple weeks If progressive troubles or worse to notify us Allergy medicines OTC recommended

## 2023-05-21 DIAGNOSIS — Z3202 Encounter for pregnancy test, result negative: Secondary | ICD-10-CM | POA: Diagnosis not present

## 2023-05-21 DIAGNOSIS — Z3042 Encounter for surveillance of injectable contraceptive: Secondary | ICD-10-CM | POA: Diagnosis not present

## 2023-06-11 IMAGING — DX DG LUMBAR SPINE 2-3V
3 series · 3 of 3 positions shown · non-contrast
Comparison: None.

CLINICAL DATA: Low back pain since 9733

EXAM:
LUMBAR SPINE - 2-3 VIEW

[l-spine ap]
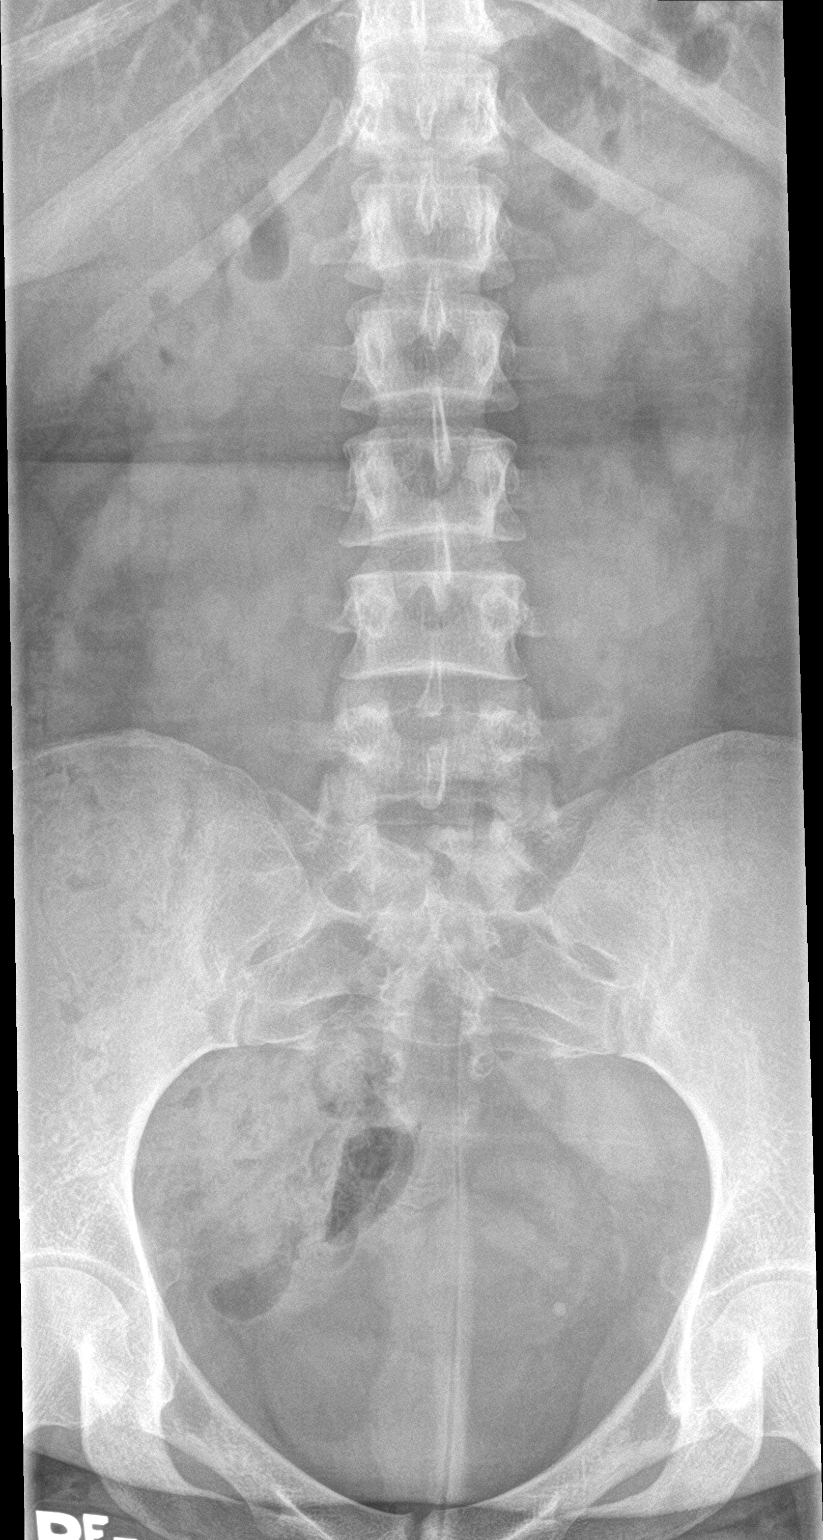

[l-spine lat]
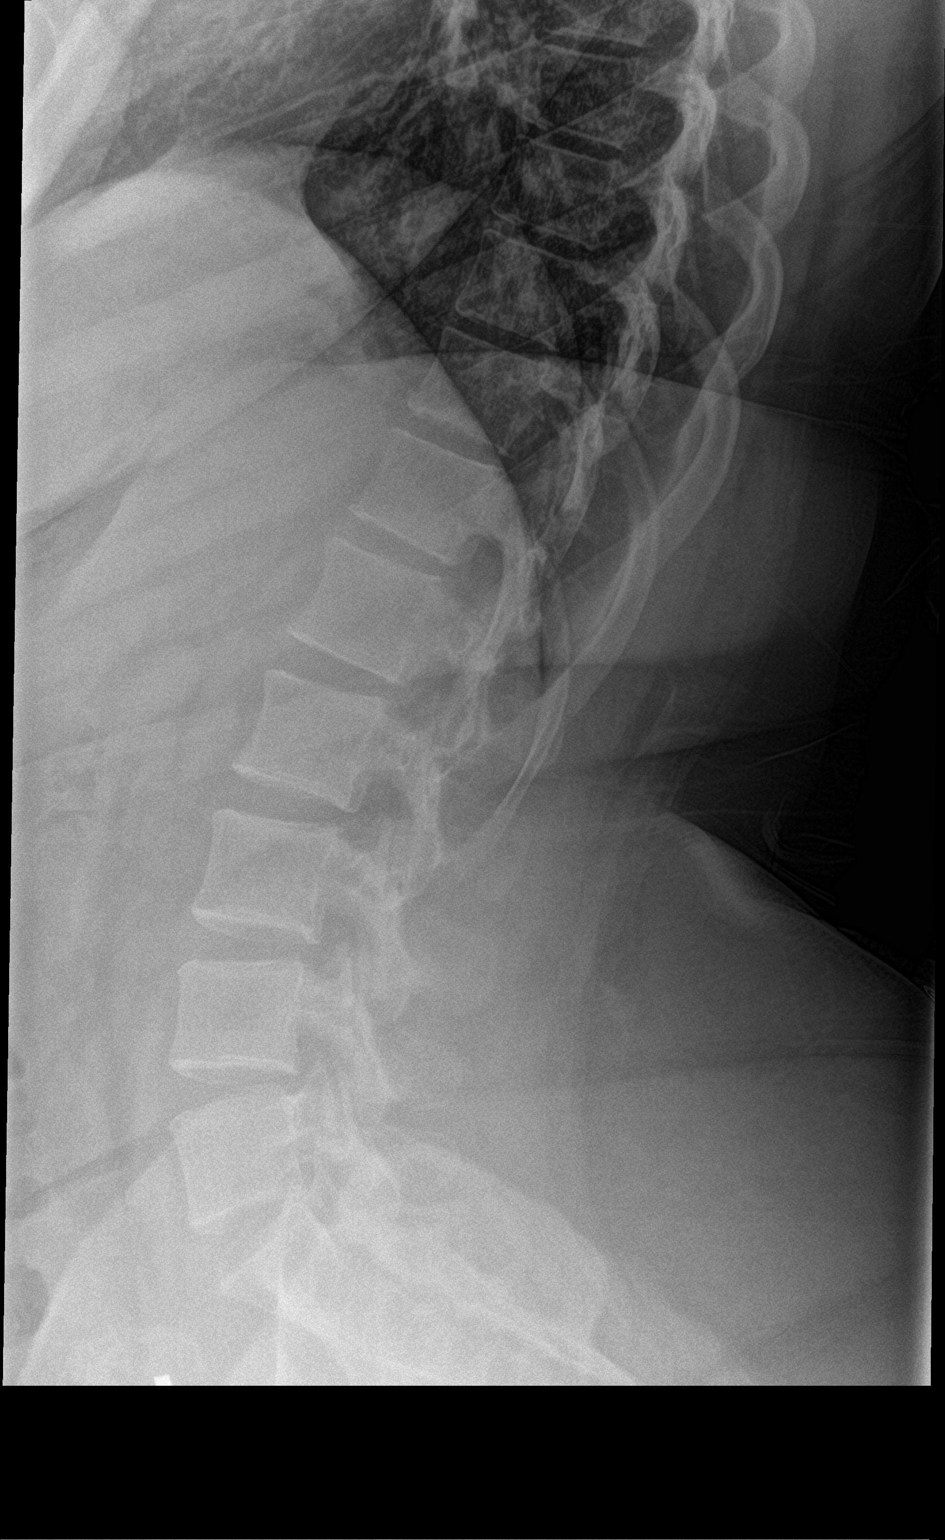

[l-spine spot]
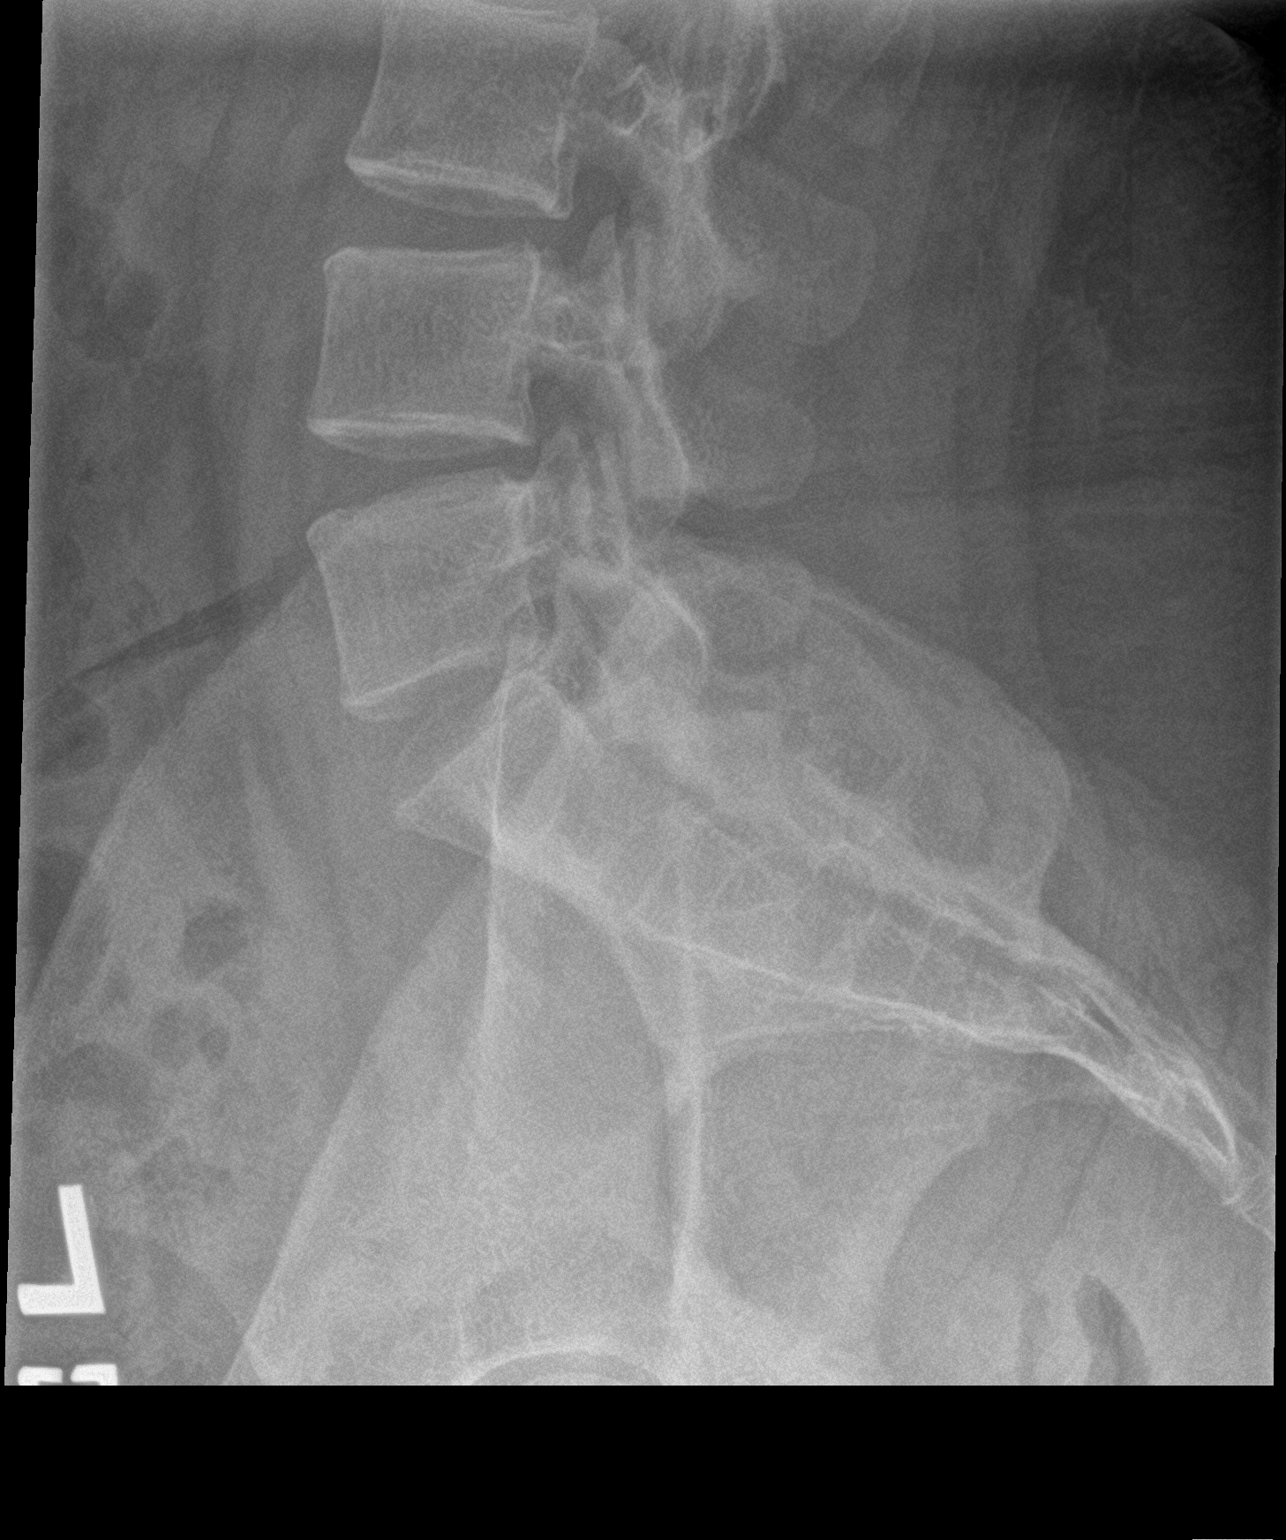

[3 of 3 positions shown; findings below may reference images not displayed]

FINDINGS: There is no evidence of lumbar spine fracture. Alignment is normal.
Intervertebral disc spaces are maintained. Incomplete fusion of the
posterior elements of S1 noted incidentally. This is of no clinical
significance.
IMPRESSION: Negative.

## 2023-06-19 DIAGNOSIS — Z6841 Body Mass Index (BMI) 40.0 and over, adult: Secondary | ICD-10-CM | POA: Diagnosis not present

## 2023-06-19 DIAGNOSIS — Z13220 Encounter for screening for lipoid disorders: Secondary | ICD-10-CM | POA: Diagnosis not present

## 2023-06-19 DIAGNOSIS — Z130 Encounter for screening for diseases of the blood and blood-forming organs and certain disorders involving the immune mechanism: Secondary | ICD-10-CM | POA: Diagnosis not present

## 2023-06-19 DIAGNOSIS — Z01419 Encounter for gynecological examination (general) (routine) without abnormal findings: Secondary | ICD-10-CM | POA: Diagnosis not present

## 2023-06-19 DIAGNOSIS — Z3202 Encounter for pregnancy test, result negative: Secondary | ICD-10-CM | POA: Diagnosis not present

## 2023-06-19 DIAGNOSIS — Z1329 Encounter for screening for other suspected endocrine disorder: Secondary | ICD-10-CM | POA: Diagnosis not present

## 2023-06-19 DIAGNOSIS — Z131 Encounter for screening for diabetes mellitus: Secondary | ICD-10-CM | POA: Diagnosis not present

## 2023-06-25 ENCOUNTER — Ambulatory Visit: Payer: Commercial Managed Care - PPO | Admitting: Nurse Practitioner

## 2023-06-25 VITALS — BP 108/62 | HR 99 | Temp 98.5°F | Ht 59.0 in

## 2023-06-25 DIAGNOSIS — F909 Attention-deficit hyperactivity disorder, unspecified type: Secondary | ICD-10-CM | POA: Diagnosis not present

## 2023-06-25 DIAGNOSIS — F32A Depression, unspecified: Secondary | ICD-10-CM | POA: Diagnosis not present

## 2023-06-25 DIAGNOSIS — K580 Irritable bowel syndrome with diarrhea: Secondary | ICD-10-CM | POA: Diagnosis not present

## 2023-06-25 DIAGNOSIS — F419 Anxiety disorder, unspecified: Secondary | ICD-10-CM

## 2023-06-25 DIAGNOSIS — G43009 Migraine without aura, not intractable, without status migrainosus: Secondary | ICD-10-CM

## 2023-06-25 MED ORDER — COLESTIPOL HCL 1 G PO TABS: ORAL_TABLET | ORAL | 0 refills | Status: DC

## 2023-06-25 MED ORDER — AMPHETAMINE-DEXTROAMPHET ER 20 MG PO CP24
20.0000 mg | ORAL_CAPSULE | ORAL | 0 refills | Status: DC
Start: 2023-06-25 — End: 2023-10-11

## 2023-06-25 MED ORDER — AMPHETAMINE-DEXTROAMPHET ER 20 MG PO CP24: 20.0000 mg | ORAL_CAPSULE | ORAL | 0 refills | Status: DC

## 2023-06-25 MED ORDER — FLUOXETINE HCL 10 MG PO CAPS: ORAL_CAPSULE | ORAL | 0 refills | Status: DC

## 2023-06-25 NOTE — Progress Notes (Unsigned)
Subjective:    Patient ID: Brandy [name], female    DOB: 09/15/2001, 22 y.o.   MRN: [id number]  HPI  This patient has adult ADD. Takes medication responsibly. Medication does help the patient focus in be [name] functional. Patient relates that they are or not abusing the medication or misusing the medication. The patient understands that if they're having any negative side effects such as elevated high blood pressure severe headaches they would need stop the medication follow-up immediately. They also understand that the prescriptions are to last for 3 months then the patient will need to follow-up before having further prescriptions.  Patient compliance patient lives in [place] and appt's were cancelled x 2 has not been able to get refills - was on 20 mg adderall XR  Does medication help patient function /attention better    Side effects: None      06/25/2023    2:51 PM 09/01/2022    4:34 PM 10/01/2020    3:23 PM 09/03/2020    1:14 PM  GAD 7 : Generalized Anxiety Score  Nervous, Anxious, on Edge 3 3 3 3   Control/stop worrying 3 3 3 3   Worry too much - different things 3 3 3 3   Trouble relaxing 3 3 3 1   Restless 1 2 2  0  Easily annoyed or irritable 3 3 3 3   Afraid - awful might happen 2 2 3  0  Total GAD 7 Score 18 19 20 13   Anxiety Difficulty Very difficult Extremely difficult Extremely difficult Somewhat difficult       06/25/2023    2:50 PM  Depression screen PHQ 2/9  Decreased Interest 3  Down, Depressed, Hopeless 2  PHQ - 2 Score 5  Altered sleeping 3  Tired, decreased energy 3  Change in appetite 3  Feeling bad or failure about yourself  3  Trouble concentrating 3  Moving slowly or fidgety/restless 1  Suicidal thoughts 0  PHQ-9 Score 21  Difficult doing work/chores Very difficult  Patient noticed recently that the Adderall did not seem to be working as well as it did before.  This began around the time her stress level increased.  Patient is under tremendous  amounts of stress working several jobs and getting ready to go to graduate school.  Has taken Lexapro and Zoloft in the past but had an exacerbation of her migraine.  States her father takes Prozac.  Overall migraines have been under good control, needs a refill on her Maxalt.  Had a recent migraine but had been working outside in the heat for about 15 hours, states she was hydrated.  None since.  Has a very active job.  Also requesting a prescription for Colestid.  Would like to take this on a rare occasion when she has IBS diarrhea.  Her mother takes this for postcholecystectomy diarrhea and patient tried 1 recently and it greatly helped her symptoms. Denies suicidal or homicidal thoughts or ideation.  Denies any self-harm behaviors.  Review of Systems  Constitutional:  Positive for fatigue.  HENT:  Negative for sore throat and trouble swallowing.   Respiratory:  Negative for cough, chest tightness and shortness of breath.   Cardiovascular:  Negative for chest pain and palpitations.  Gastrointestinal:  Positive for diarrhea. Negative for blood in stool and constipation.  Psychiatric/Behavioral:  Positive for decreased concentration, dysphoric mood and sleep disturbance. Negative for self-injury and suicidal ideas. The patient is nervous/anxious.        Objective:   Physical Exam  NAD.  Alert, oriented.  Thyroid nontender to palpation, no mass or goiter noted.  Lungs clear.  Heart regular rate rhythm.  Lower extremities no edema.  Abdomen soft nondistended nontender. Today's Vitals   06/25/23 1449  BP: 108/62  Pulse: 99  Temp: 98.5 F (36.9 C)  SpO2: 98%  Height: 4\' 11"  (1.499 m)   Body mass index is 35.87 kg/m.        Assessment & Plan:   Problem List Items Addressed This Visit       Cardiovascular and Mediastinum   Migraine without aura and without status migrainosus, not intractable   Relevant Medications   FLUoxetine (PROZAC) 10 MG capsule   colestipol (COLESTID) 1 g  tablet   rizatriptan (MAXALT) 10 MG tablet     Digestive   Irritable bowel syndrome with diarrhea     Other   Adult ADHD - Primary   Anxiety and depression   Relevant Medications   FLUoxetine (PROZAC) 10 MG capsule   Meds ordered this encounter  Medications   FLUoxetine (PROZAC) 10 MG capsule    Sig: Take one 10 mg cap po once daily x 6 days then increase to 2 po every day (20 mg total)    Dispense:  60 capsule    Refill:  0    Order Specific Question:   Supervising Provider    Answer:   Lilyan Punt A [[id number]]   amphetamine-dextroamphetamine (ADDERALL XR) 20 MG 24 hr capsule    Sig: Take 1 capsule (20 mg total) by mouth every morning.    Dispense:  30 capsule    Refill:  0    Order Specific Question:   Supervising Provider    Answer:   Lilyan Punt A [[id number]]   amphetamine-dextroamphetamine (ADDERALL XR) 20 MG 24 hr capsule    Sig: Take 1 capsule (20 mg total) by mouth every morning.    Dispense:  30 capsule    Refill:  0    May fill 30 days from 06/25/23    Order Specific Question:   Supervising Provider    Answer:   Lilyan Punt A [[id number]]   amphetamine-dextroamphetamine (ADDERALL XR) 20 MG 24 hr capsule    Sig: Take 1 capsule (20 mg total) by mouth every morning.    Dispense:  30 capsule    Refill:  0    May fill 60 days from 06/25/23    Order Specific Question:   Supervising Provider    Answer:   Lilyan Punt A [[id number]]   colestipol (COLESTID) 1 g tablet    Sig: Take one po once daily prn diarrhea    Dispense:  30 tablet    Refill:  0    Order Specific Question:   Supervising Provider    Answer:   Lilyan Punt A [[id number]]   rizatriptan (MAXALT) 10 MG tablet    Sig: Take 1 tablet (10 mg total) by mouth as needed for migraine. May repeat in 2 hours if needed; Max 2 tablets per 24 hours    Dispense:  10 tablet    Refill:  0    Order Specific Question:   Supervising Provider    Answer:   Lilyan Punt A [[id number]]   Restart Adderall XR 20 mg daily.  Lengthy discussion  regarding overlap of symptoms from ADHD and anxiety.  Trial of low-dose Prozac 10 mg with slow titration as needed or tolerated.  Reviewed potential adverse effects including black box warnings.  Patient to discontinue medication  immediately and contact office if any problems.  Continue to use Maxalt on a rare basis for migraines, call back if increase in the number or new symptoms.  Given a prescription for Colestid to take for persistent IBS diarrhea.  Cautioned about constipation. Discussed the importance of stress reduction. Return in about 1 month (around 07/26/2023) for Video or phone is fine. Call back sooner if needed

## 2023-06-26 ENCOUNTER — Encounter: Payer: Self-pay | Admitting: Nurse Practitioner

## 2023-06-26 MED ORDER — RIZATRIPTAN BENZOATE 10 MG PO TABS: 10.0000 mg | ORAL_TABLET | ORAL | 0 refills | Status: AC | PRN

## 2023-06-27 ENCOUNTER — Other Ambulatory Visit: Payer: Self-pay | Admitting: Nurse Practitioner

## 2023-06-27 MED ORDER — FLUOXETINE HCL 10 MG PO CAPS: ORAL_CAPSULE | ORAL | 0 refills | Status: DC

## 2023-06-27 MED ORDER — FLUOXETINE HCL 20 MG PO CAPS: 20.0000 mg | ORAL_CAPSULE | Freq: Every day | ORAL | 0 refills | Status: DC

## 2023-07-13 ENCOUNTER — Ambulatory Visit: Payer: Commercial Managed Care - PPO | Admitting: Nurse Practitioner

## 2023-07-19 ENCOUNTER — Other Ambulatory Visit: Payer: Self-pay | Admitting: Nurse Practitioner

## 2023-07-27 ENCOUNTER — Telehealth (INDEPENDENT_AMBULATORY_CARE_PROVIDER_SITE_OTHER): Payer: Commercial Managed Care - PPO | Admitting: Nurse Practitioner

## 2023-07-27 DIAGNOSIS — F419 Anxiety disorder, unspecified: Secondary | ICD-10-CM

## 2023-07-27 DIAGNOSIS — F32A Depression, unspecified: Secondary | ICD-10-CM | POA: Diagnosis not present

## 2023-07-27 MED ORDER — FLUOXETINE HCL 20 MG PO CAPS: 20.0000 mg | ORAL_CAPSULE | Freq: Every day | ORAL | 0 refills | Status: DC

## 2023-07-27 NOTE — Progress Notes (Unsigned)
Virtual Visit via Video Note  I connected with Brandy [name] on 07/27/23 at  3:00 PM EDT by a video enabled telemedicine application and verified that I am speaking with the correct person using two identifiers.  Location: Patient: car Provider: office   I discussed the limitations of evaluation and management by telemedicine and the availability of in person appointments. The patient expressed understanding and agreed to proceed.  History of Present Illness: Presents for recheck on Prozac. Currently on 20 mg dose. Has seen improvement in anxiety and depression. Has been under more stress for the past 2 weeks due to the death of an aunt. Starts her classes in 2 weeks. Has noticed more sweating lately. Unsure if this is due to Prozac but not enough of an issue to stop medication.     Observations/Objective: NAD. Alert, oriented. Calm, cheerful affect. Making good eye contact.   Assessment and Plan: Problem List Items Addressed This Visit       Other   Anxiety and depression - Primary   Relevant Medications   FLUoxetine (PROZAC) 20 MG capsule   Meds ordered this encounter  Medications   FLUoxetine (PROZAC) 20 MG capsule    Sig: Take 1 capsule (20 mg total) by mouth daily.    Dispense:  90 capsule    Refill:  0    Order Specific Question:   Supervising Provider    Answer:   Lilyan Punt A [[id number]]     Follow Up Instructions: Continue current dose of Prozac as directed.  Recommend stress reduction and regular activity.  Return for Routine follow up for ADD. Call back sooner if any problems.    I discussed the assessment and treatment plan with the patient. The patient was provided an opportunity to ask questions and all were answered. The patient agreed with the plan and demonstrated an understanding of the instructions.   The patient was advised to call back or seek an in-person evaluation if the symptoms worsen or if the condition fails to improve as anticipated.  I  provided 15 minutes of non-face-to-face time during this encounter.   Campbell Riches, NP

## 2023-07-29 ENCOUNTER — Encounter: Payer: Self-pay | Admitting: Nurse Practitioner

## 2023-08-29 DIAGNOSIS — Z3202 Encounter for pregnancy test, result negative: Secondary | ICD-10-CM | POA: Diagnosis not present

## 2023-08-29 DIAGNOSIS — Z3042 Encounter for surveillance of injectable contraceptive: Secondary | ICD-10-CM | POA: Diagnosis not present

## 2023-09-06 ENCOUNTER — Other Ambulatory Visit: Payer: Self-pay | Admitting: Nurse Practitioner

## 2023-09-07 ENCOUNTER — Telehealth: Payer: Self-pay | Admitting: Nurse Practitioner

## 2023-09-07 ENCOUNTER — Other Ambulatory Visit: Payer: Self-pay | Admitting: Nurse Practitioner

## 2023-09-07 MED ORDER — AMPHETAMINE-DEXTROAMPHET ER 20 MG PO CP24: 20.0000 mg | ORAL_CAPSULE | ORAL | 0 refills | Status: DC

## 2023-09-07 NOTE — Telephone Encounter (Signed)
Patient need a transfer for ADD medication sent to [facility] amphetamine-dextroamphetamine (ADDERALL XR) 20 MG 24 hr capsule

## 2023-10-11 ENCOUNTER — Encounter: Payer: Self-pay | Admitting: Nurse Practitioner

## 2023-10-11 ENCOUNTER — Telehealth: Payer: Commercial Managed Care - PPO | Admitting: Nurse Practitioner

## 2023-10-11 DIAGNOSIS — F32A Depression, unspecified: Secondary | ICD-10-CM

## 2023-10-11 DIAGNOSIS — F909 Attention-deficit hyperactivity disorder, unspecified type: Secondary | ICD-10-CM

## 2023-10-11 DIAGNOSIS — F419 Anxiety disorder, unspecified: Secondary | ICD-10-CM | POA: Diagnosis not present

## 2023-10-11 DIAGNOSIS — G43009 Migraine without aura, not intractable, without status migrainosus: Secondary | ICD-10-CM | POA: Diagnosis not present

## 2023-10-11 MED ORDER — TOPIRAMATE 50 MG PO TABS: ORAL_TABLET | ORAL | 0 refills | Status: DC

## 2023-10-11 MED ORDER — PHENTERMINE HCL 37.5 MG PO TABS: 37.5000 mg | ORAL_TABLET | Freq: Every day | ORAL | 2 refills | Status: DC

## 2023-10-13 NOTE — Progress Notes (Signed)
Virtual Visit via Video Note  I connected with DAWNETTE KROGH on 10/13/23 at  4:00 PM EDT by a video enabled telemedicine application and verified that I am speaking with the correct person using two identifiers.  Location: Patient: car Provider: office   I discussed the limitations of evaluation and management by telemedicine and the availability of in person appointments. The patient expressed understanding and agreed to proceed.  History of Present Illness: Presents by video to discuss her ADHD and migraines.  Has had some difficulty with Adderall, states she "crashes hard" when it wears off in the afternoon.  Also has long days with school and work and medication does not cover this time.  Denies any adverse effects otherwise from Adderall.  States she felt the best when she was taking phentermine, helped her weight as well as her focus.  Has gained back up to 290+ pounds.  Has a very active job requiring a lot of walking.  Only eats once a day most days, and usually eats late at night 830 to 9 PM.  Sleep has been an issue lately, gets up 2-3 times a night mainly due to her new dog.  Has been having more migraines lately, can only take her Maxalt at night since it causes drowsiness.  Currently on Depo-Provera for birth control and is up-to-date on her injections.  Anxiety and depression doing well with fluoxetine 20 mg daily.  Has hydroxyzine and gabapentin on hand but rarely takes these medications.  Was on topiramate years ago but discontinued it due to change in taste regarding sodas.  Otherwise denies any adverse effects.   Observations/Objective: NAD.  Alert, oriented.  Speech clear.  Cheerful affect.  Making good eye contact.  Assessment and Plan: Problem List Items Addressed This Visit       Cardiovascular and Mediastinum   Migraine without aura and without status migrainosus, not intractable - Primary   Relevant Medications   topiramate (TOPAMAX) 50 MG tablet     Other   Adult  ADHD   Anxiety and depression   Morbid obesity (HCC)   Relevant Medications   phentermine (ADIPEX-P) 37.5 MG tablet   Meds ordered this encounter  Medications   phentermine (ADIPEX-P) 37.5 MG tablet    Sig: Take 1 tablet (37.5 mg total) by mouth daily before breakfast.    Dispense:  30 tablet    Refill:  2    Order Specific Question:   Supervising Provider    Answer:   Lilyan Punt A [[id number]]   topiramate (TOPAMAX) 50 MG tablet    Sig: Take one tab po at bedtime for migraine prevention    Dispense:  90 tablet    Refill:  0    Order Specific Question:   Supervising Provider    Answer:   Lilyan Punt A [[id number]]   Discussed options at length.  Discontinue Adderall.  Continue Prozac as directed.  Explained to patient that phentermine while it may help concentration is not approved for this use but we can restart it temporarily to help jumpstart her weight loss.  Will need to look at other options once this is complete.  Continue her activity.  Discussed importance of healthy diet including regular meals plus healthy snacks and avoiding eating late at night. Restart topiramate as directed.  Will start with low-dose.  Patient understands she needs to continue reliable birth control and the risk of birth defects if she becomes pregnant.    Follow Up Instructions: Return in  about 3 months (around 01/11/2024) for virtual or in person is fine. Call back sooner if any problems.   I discussed the assessment and treatment plan with the patient. The patient was provided an opportunity to ask questions and all were answered. The patient agreed with the plan and demonstrated an understanding of the instructions.   The patient was advised to call back or seek an in-person evaluation if the symptoms worsen or if the condition fails to improve as anticipated.  I provided 25 minutes of non-face-to-face time during this encounter.   Campbell Riches, NP

## 2023-11-07 ENCOUNTER — Other Ambulatory Visit: Payer: Self-pay | Admitting: Nurse Practitioner

## 2023-11-07 MED ORDER — FLUOXETINE HCL 20 MG PO CAPS: 20.0000 mg | ORAL_CAPSULE | Freq: Every day | ORAL | 0 refills | Status: DC

## 2023-11-23 DIAGNOSIS — Z30013 Encounter for initial prescription of injectable contraceptive: Secondary | ICD-10-CM | POA: Diagnosis not present

## 2024-01-01 ENCOUNTER — Other Ambulatory Visit: Payer: Self-pay | Admitting: Nurse Practitioner

## 2024-01-03 ENCOUNTER — Encounter: Payer: Self-pay | Admitting: Family Medicine

## 2024-02-21 DIAGNOSIS — H8113 Benign paroxysmal vertigo, bilateral: Secondary | ICD-10-CM | POA: Diagnosis not present

## 2024-02-21 DIAGNOSIS — Z3009 Encounter for other general counseling and advice on contraception: Secondary | ICD-10-CM | POA: Diagnosis not present

## 2024-02-21 DIAGNOSIS — Z3042 Encounter for surveillance of injectable contraceptive: Secondary | ICD-10-CM | POA: Diagnosis not present

## 2024-05-05 ENCOUNTER — Ambulatory Visit: Admitting: Nurse Practitioner

## 2024-05-05 ENCOUNTER — Encounter: Payer: Self-pay | Admitting: Nurse Practitioner

## 2024-05-05 VITALS — BP 97/66 | HR 102 | Temp 98.2°F | Ht 59.0 in | Wt 226.0 lb

## 2024-05-05 DIAGNOSIS — F909 Attention-deficit hyperactivity disorder, unspecified type: Secondary | ICD-10-CM

## 2024-05-05 DIAGNOSIS — F419 Anxiety disorder, unspecified: Secondary | ICD-10-CM

## 2024-05-05 DIAGNOSIS — F4323 Adjustment disorder with mixed anxiety and depressed mood: Secondary | ICD-10-CM | POA: Diagnosis not present

## 2024-05-05 DIAGNOSIS — F32A Depression, unspecified: Secondary | ICD-10-CM | POA: Diagnosis not present

## 2024-05-05 MED ORDER — LISDEXAMFETAMINE DIMESYLATE 20 MG PO CAPS: 20.0000 mg | ORAL_CAPSULE | Freq: Every day | ORAL | 0 refills | Status: DC

## 2024-05-05 MED ORDER — FLUOXETINE HCL 20 MG PO CAPS: 20.0000 mg | ORAL_CAPSULE | Freq: Every day | ORAL | 0 refills | Status: DC

## 2024-05-05 NOTE — Progress Notes (Signed)
 Subjective:    Patient ID: Brandy [name], female    DOB: 09/08/2001, 23 y.o.   MRN: [id number]  HPI Follow up medications  ,pt had not been taking some medications due to being away at college and not able to get any virtual appts Presents for recheck on her anxiety and depression and her ADHD.  Currently in school working on her masters in social work.  Has 1 more year.  Will be starting a new job in the next few days.  Has also been looking for new home.  Has been struggling with her anxiety and depression since stopping her fluoxetine  20 mg a few months ago.  Has also stopped her phentermine .  With her new job and history of ADHD would like to try Vyvanse .  Headaches are much improved, no longer on topiramate .  Does not have a current sexual partner.  Has an appointment with gynecology in [place] on 6/1.  According to patient she has been on Depo-Provera  total of 5 to 6 years.  It is her understanding that she needs to switch off medication per guidelines.  Has tried multiple pills but continued to have problems with her cycle.  Had an IUD at 1 point but had it removed after 1 year due to severe cramping.  Since she is not sexually active patient is considering no contraceptive or hormones to see what her body will do as far as her cycles. Denies suicidal or homicidal thoughts or ideation.  Denies any self-harm behaviors.  Review of Systems  Constitutional:  Positive for fatigue.  Respiratory:  Negative for cough, chest tightness and shortness of breath.   Cardiovascular:  Negative for chest pain.  Psychiatric/Behavioral:  Positive for decreased concentration, dysphoric mood and sleep disturbance. Negative for self-injury and suicidal ideas. The patient is nervous/anxious.       05/05/2024   10:22 AM  Depression screen PHQ 2/9  Decreased Interest 3  Down, Depressed, Hopeless 2  PHQ - 2 Score 5  Altered sleeping 3  Tired, decreased energy 3  Change in appetite 3  Feeling bad or  failure about yourself  3  Trouble concentrating 3  Moving slowly or fidgety/restless 1  Suicidal thoughts 0  PHQ-9 Score 21  Difficult doing work/chores Very difficult      05/05/2024   10:22 AM 06/25/2023    2:51 PM 09/01/2022    4:34 PM 10/01/2020    3:23 PM  GAD 7 : Generalized Anxiety Score  Nervous, Anxious, on Edge 3 3 3 3   Control/stop worrying 3 3 3 3   Worry too much - different things 3 3 3 3   Trouble relaxing 3 3 3 3   Restless 3 1 2 2   Easily annoyed or irritable 3 3 3 3   Afraid - awful might happen 1 2 2 3   Total GAD 7 Score 19 18 19 20   Anxiety Difficulty Very difficult Very difficult Extremely difficult Extremely difficult    Social History   Tobacco Use   Smoking status: Never   Smokeless tobacco: Never  Vaping Use   Vaping status: Never Used  Substance Use Topics   Alcohol use: Not Currently    Alcohol/week: 0.0 standard drinks of alcohol   Drug use: Not Currently        Objective:   Physical Exam NAD.  Alert, oriented.  Mildly anxious affect.  Making good eye contact.  Speech clear.  Dressed appropriately for the weather.  Thoughts logical coherent and relevant.  Judgment  and behavior normal.  Lungs clear.  Heart regular rate rhythm. Today's Vitals   05/05/24 0853  BP: 97/66  Pulse: (!) 102  Temp: 98.2 F (36.8 C)  SpO2: 98%  Weight: 226 lb (102.5 kg)  Height: 4\' 11"  (1.499 m)   Body mass index is 45.65 kg/m.        Assessment & Plan:   Problem List Items Addressed This Visit       Other   Adjustment disorder with mixed anxiety and depressed mood   Adult ADHD - Primary   Anxiety and depression   Relevant Medications   FLUoxetine  (PROZAC ) 20 MG capsule   Meds ordered this encounter  Medications   FLUoxetine  (PROZAC ) 20 MG capsule    Sig: Take 1 capsule (20 mg total) by mouth daily.    Dispense:  30 capsule    Refill:  0    Supervising Provider:   Charlotta Cook A [[id number]]   lisdexamfetamine (VYVANSE ) 20 MG capsule    Sig: Take 1  capsule (20 mg total) by mouth daily.    Dispense:  30 capsule    Refill:  0    Please dispense name brand Vyvanse  per Medicaid formulary    Supervising Provider:   Bennet Brasil [[id number]]   Patient agrees to restart fluoxetine  20 mg daily especially considering her level of anxiety and depression.  Plan to slowly titrate as needed and tolerated each month.  Patient to send messages through MyChart to update her status. Trial of Vyvanse  20 mg daily.  Reviewed potential adverse effects.  Discontinue medication and contact office if any problems.  Again plan to slowly titrated as needed and tolerated each month. Once patient is settled as far as her benefits and insurance status, consider mental health counseling through telehealth. Follow-up with gynecology as planned.  Strongly recommend that she discontinue Depo-Provera  permanently. Return in about 3 months (around 08/05/2024) for video visit .

## 2024-06-06 ENCOUNTER — Encounter: Payer: Self-pay | Admitting: Nurse Practitioner

## 2024-06-06 ENCOUNTER — Other Ambulatory Visit: Payer: Self-pay | Admitting: Nurse Practitioner

## 2024-06-06 MED ORDER — FLUOXETINE HCL 20 MG PO CAPS: 20.0000 mg | ORAL_CAPSULE | Freq: Every day | ORAL | 0 refills | Status: DC

## 2024-06-06 MED ORDER — LISDEXAMFETAMINE DIMESYLATE 30 MG PO CAPS: 30.0000 mg | ORAL_CAPSULE | Freq: Every day | ORAL | 0 refills | Status: DC

## 2024-07-10 ENCOUNTER — Other Ambulatory Visit: Payer: Self-pay | Admitting: Nurse Practitioner

## 2024-07-10 ENCOUNTER — Encounter: Payer: Self-pay | Admitting: Nurse Practitioner

## 2024-07-10 MED ORDER — LISDEXAMFETAMINE DIMESYLATE 40 MG PO CAPS: 40.0000 mg | ORAL_CAPSULE | ORAL | 0 refills | Status: DC

## 2024-07-11 ENCOUNTER — Other Ambulatory Visit: Payer: Self-pay

## 2024-07-11 MED ORDER — LISDEXAMFETAMINE DIMESYLATE 40 MG PO CAPS: 40.0000 mg | ORAL_CAPSULE | ORAL | 0 refills | Status: DC

## 2024-07-14 ENCOUNTER — Other Ambulatory Visit (HOSPITAL_COMMUNITY): Payer: Self-pay

## 2024-07-14 ENCOUNTER — Telehealth: Payer: Self-pay | Admitting: Pharmacy Technician

## 2024-07-14 NOTE — Telephone Encounter (Signed)
 Pharmacy Patient Advocate Encounter   Received notification from CoverMyMeds that prior authorization for Vyvanse  40MG  capsules is required/requested.   Insurance verification completed.   The patient is insured through [facility] .   Per test claim: Refill too soon. PA is not needed at this time. Medication was filled 07/12/2024. Next eligible fill date is 08/04/2024.

## 2024-08-14 ENCOUNTER — Other Ambulatory Visit: Payer: Self-pay | Admitting: Nurse Practitioner

## 2024-08-14 ENCOUNTER — Encounter: Payer: Self-pay | Admitting: Nurse Practitioner

## 2024-08-14 MED ORDER — LISDEXAMFETAMINE DIMESYLATE 40 MG PO CAPS: 40.0000 mg | ORAL_CAPSULE | ORAL | 0 refills | Status: DC

## 2024-09-09 ENCOUNTER — Other Ambulatory Visit: Payer: Self-pay | Admitting: Medical Genetics

## 2024-09-11 ENCOUNTER — Telehealth: Admitting: Nurse Practitioner

## 2024-09-11 DIAGNOSIS — F909 Attention-deficit hyperactivity disorder, unspecified type: Secondary | ICD-10-CM

## 2024-09-11 DIAGNOSIS — F4323 Adjustment disorder with mixed anxiety and depressed mood: Secondary | ICD-10-CM | POA: Diagnosis not present

## 2024-09-13 ENCOUNTER — Encounter: Payer: Self-pay | Admitting: Nurse Practitioner

## 2024-09-13 MED ORDER — LISDEXAMFETAMINE DIMESYLATE 40 MG PO CAPS: 40.0000 mg | ORAL_CAPSULE | ORAL | 0 refills | Status: DC

## 2024-09-13 MED ORDER — FLUOXETINE HCL 40 MG PO CAPS: 40.0000 mg | ORAL_CAPSULE | Freq: Every day | ORAL | 0 refills | Status: DC

## 2024-09-13 NOTE — Progress Notes (Signed)
 Patient ID: Brandy [name], female   DOB: 08-25-2001, 23 y.o.   MRN: [id number] Virtual Visit via Video Note  I connected with Brandy [name] on 09/13/24 at  2:10 PM EDT by a video enabled telemedicine application and verified that I am speaking with the correct person using two identifiers.  Location: Patient: car Provider: office   I discussed the limitations of evaluation and management by telemedicine and the availability of in person appointments. The patient expressed understanding and agreed to proceed.  History of Present Illness: Presents for recheck on anxiety/depression and ADHD. Describes herself as overly stressed and anxiety out the roof. For the first time experienced a panic attack with increased BP, motion sickness and cold sweat. Resigned from her position on 9/4 which has become complicated.  Stopped her contraceptive in June. Has had 3 cycles since that time. Defers another contraceptive. No plans for pregnancy.    Observations/Objective: Alert, oriented. Mildly anxious affect. Making good eye contact. Speech clear. Behavior and mood normal.   Assessment and Plan: 1. Adjustment disorder with mixed anxiety and depressed mood (Primary) Trial of increased Fluoxetine  dose. Contact office if any adverse effects.  Recommend considering counseling.  - FLUoxetine  (PROZAC ) 40 MG capsule; Take 1 capsule (40 mg total) by mouth daily.  Dispense: 90 capsule; Refill: 0  2. Adult ADHD Continue Vyvanse  at current dose. Discussed contraception. Patient understands she should not take Vyvanse  if pregnant.  - lisdexamfetamine (VYVANSE ) 40 MG capsule; Take 1 capsule (40 mg total) by mouth every morning.  Dispense: 30 capsule; Refill: 0 - lisdexamfetamine (VYVANSE ) 40 MG capsule; Take 1 capsule (40 mg total) by mouth every morning.  Dispense: 30 capsule; Refill: 0 - lisdexamfetamine (VYVANSE ) 40 MG capsule; Take 1 capsule (40 mg total) by mouth every morning.  Dispense: 30 capsule;  Refill: 0   Follow Up Instructions: Return in about 3 months (around 12/11/2024). Call back sooner if needed.    I discussed the assessment and treatment plan with the patient. The patient was provided an opportunity to ask questions and all were answered. The patient agreed with the plan and demonstrated an understanding of the instructions.   The patient was advised to call back or seek an in-person evaluation if the symptoms worsen or if the condition fails to improve as anticipated.  I provided 15 minutes of non-face-to-face time during this encounter.   Elveria JAYSON Quarry, NP

## 2024-09-29 ENCOUNTER — Other Ambulatory Visit (HOSPITAL_COMMUNITY)
Admission: RE | Admit: 2024-09-29 | Discharge: 2024-09-29 | Disposition: A | Discharge status: Home / Self Care | Payer: Self-pay | Source: Ambulatory Visit | Attending: Medical Genetics | Admitting: Medical Genetics

## 2024-10-07 LAB — GENECONNECT MOLECULAR SCREEN: Genetic Analysis Overall Interpretation: NEGATIVE

## 2024-11-18 ENCOUNTER — Encounter (INDEPENDENT_AMBULATORY_CARE_PROVIDER_SITE_OTHER): Payer: Self-pay

## 2024-11-24 ENCOUNTER — Telehealth: Admitting: Physician Assistant

## 2024-11-24 DIAGNOSIS — B9689 Other specified bacterial agents as the cause of diseases classified elsewhere: Secondary | ICD-10-CM

## 2024-11-24 MED ORDER — AZITHROMYCIN 250 MG PO TABS: ORAL_TABLET | ORAL | 0 refills | Status: AC

## 2024-11-24 MED ORDER — BENZONATATE 100 MG PO CAPS: 100.0000 mg | ORAL_CAPSULE | Freq: Three times a day (TID) | ORAL | 0 refills | Status: DC | PRN

## 2024-11-24 NOTE — Progress Notes (Signed)
 We are sorry that you are not feeling well.  Here is how we plan to help!  Based on your presentation I believe you most likely have A cough due to bacteria.  When patients have a productive cough with a change in color or increased sputum production, we are concerned about bacterial bronchitis.  If left untreated it can progress to pneumonia.  If your symptoms do not improve with your treatment plan it is important that you contact your provider.   I have prescribed Azithromyin 250 mg: two tablets now and then one tablet daily for 4 additonal days    In addition I  have sent in a prescription cough medication, benzonatate , to take as directed.  From your responses in the eVisit questionnaire you describe inflammation in the upper respiratory tract which is causing a significant cough.  This is commonly called Bronchitis and has four common causes:   Allergies Viral Infections Acid Reflux Bacterial Infection Allergies, viruses and acid reflux are treated by controlling symptoms or eliminating the cause. An example might be a cough caused by taking certain blood pressure medications. You stop the cough by changing the medication. Another example might be a cough caused by acid reflux. Controlling the reflux helps control the cough.  USE OF BRONCHODILATOR (RESCUE) INHALERS: There is a risk from using your bronchodilator too frequently.  The risk is that over-reliance on a medication which only relaxes the muscles surrounding the breathing tubes can reduce the effectiveness of medications prescribed to reduce swelling and congestion of the tubes themselves.  Although you feel brief relief from the bronchodilator inhaler, your asthma may actually be worsening with the tubes becoming more swollen and filled with mucus.  This can delay other crucial treatments, such as oral steroid medications. If you need to use a bronchodilator inhaler daily, several times per day, you should discuss this with your  provider.  There are probably better treatments that could be used to keep your asthma under control.     HOME CARE Only take medications as instructed by your medical team. Complete the entire course of an antibiotic. Drink plenty of fluids and get plenty of rest. Avoid close contacts especially the very young and the elderly Cover your mouth if you cough or cough into your sleeve. Always remember to wash your hands A steam or ultrasonic humidifier can help congestion.   GET HELP RIGHT AWAY IF: You develop worsening fever. You become short of breath You cough up blood. Your symptoms persist after you have completed your treatment plan MAKE SURE YOU  Understand these instructions. Will watch your condition. Will get help right away if you are not doing well or get worse.  Your e-visit answers were reviewed by a board certified advanced clinical practitioner to complete your personal care plan.  Depending on the condition, your plan could have included both over the counter or prescription medications. If there is a problem please reply  once you have received a response from your provider. Your safety is important to us .  If you have drug allergies check your prescription carefully.    You can use MyChart to ask questions about today's visit, request a non-urgent call back, or ask for a work or school excuse for 24 hours related to this e-Visit. If it has been greater than 24 hours you will need to follow up with your provider, or enter a new e-Visit to address those concerns. You will get an e-mail in the next two days  asking about your experience.  I hope that your e-visit has been valuable and will speed your recovery. Thank you for using e-visits.   I have spent 5 minutes in review of e-visit questionnaire, review and updating patient chart, medical decision making and response to patient.   Elsie Velma Lunger, PA-C

## 2024-11-24 NOTE — Addendum Note (Signed)
 Addended by: GLADIS ELSIE BROCKS on: 11/24/2024 09:40 AM   Modules accepted: Orders

## 2024-12-08 ENCOUNTER — Encounter: Payer: Self-pay | Admitting: Nurse Practitioner

## 2024-12-08 ENCOUNTER — Ambulatory Visit: Admitting: Nurse Practitioner

## 2024-12-08 VITALS — BP 114/78 | HR 103 | Temp 98.2°F | Ht 59.0 in | Wt 231.6 lb

## 2024-12-08 DIAGNOSIS — F4323 Adjustment disorder with mixed anxiety and depressed mood: Secondary | ICD-10-CM | POA: Diagnosis not present

## 2024-12-08 DIAGNOSIS — F909 Attention-deficit hyperactivity disorder, unspecified type: Secondary | ICD-10-CM

## 2024-12-08 DIAGNOSIS — E282 Polycystic ovarian syndrome: Secondary | ICD-10-CM

## 2024-12-08 DIAGNOSIS — R5383 Other fatigue: Secondary | ICD-10-CM

## 2024-12-08 MED ORDER — AMPHETAMINE-DEXTROAMPHET ER 20 MG PO CP24: 20.0000 mg | ORAL_CAPSULE | ORAL | 0 refills | Status: AC

## 2024-12-08 MED ORDER — METFORMIN HCL 500 MG PO TABS: ORAL_TABLET | ORAL | 0 refills | Status: AC

## 2024-12-08 NOTE — Patient Instructions (Signed)
 Brandy [name] [phone] [email or web address]

## 2024-12-08 NOTE — Progress Notes (Signed)
 "  Subjective:    Patient ID: Brandy [name], female    DOB: 05-10-2001, 23 y.o.   MRN: [id number]  HPI PT discussed weight loss, she mentioned being on a variety of medications. Fentromine worked once before, once she took it again did not work as good. PT is experiencing excessive thirst and urination. PT is hungry every 3 hours. Craves sweets.  Stopped taking anxiety meds Mentioned ADHD meds  Discussed the use of AI scribe software for clinical note transcription with the patient, who gave verbal consent to proceed.  History of Present Illness Brandy [name] is a 23 year old female with ADHD and PCOS who presents with concerns about ADHD medication availability and weight management.  She is experiencing difficulties obtaining her ADHD medication due to pharmacy shortages. She has been on Vyvanse , which was switched from Adderall to help with eating issues, but has faced a two-week delay in obtaining it. She is considering returning to Adderall, which she never had issues obtaining from the pharmacy. Her previous dose of Adderall XR was 20 mg.  She reports increased cravings, especially for sweets, and extreme thirst and urination, which are concerning given her prediabetic status and PCOS. She has a history of PCOS. With weight gain has noticed increased hair growth on her chin.   She experienced a possible chemical miscarriage in late November to early December, characterized by a positive pregnancy test followed by heavy bleeding and clotting. She is not currently on any birth control and has had regular cycles since July, except for the incident in November. She uses protection consistently but acknowledges the risk of pregnancy with her current lifestyle. Has tried multiple types of contraceptives but had issues with all except Depo Provera  but had to stop this due to new guidelines.   She has a history of anxiety, which is exacerbated by her inability to focus when off ADHD medication. She  previously tried Prozac  but did not find it effective, even at increased doses. She is considering counseling but finds it challenging to fit into her schedule due to school and internship commitments.  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 This has added to her stress and anxiety levels.     12/08/2024    9:12 AM  Depression screen PHQ 2/9  Decreased Interest 2  Down, Depressed, Hopeless 2  PHQ - 2 Score 4  Altered sleeping 2  Tired, decreased energy 3  Change in appetite 3  Feeling bad or failure about yourself  3  Trouble concentrating 3  Moving slowly or fidgety/restless 3  Suicidal thoughts 3  PHQ-9 Score 24  Difficult doing work/chores Very difficult      12/08/2024    9:12 AM 05/05/2024   10:22 AM 06/25/2023    2:51 PM 09/01/2022    4:34 PM  GAD 7 : Generalized Anxiety Score  Nervous, Anxious, on Edge 2 3 3 3   Control/stop worrying 3 3 3 3   Worry too much - different things 3 3 3 3   Trouble relaxing 2 3 3 3   Restless 2 3 1 2   Easily annoyed or irritable 3 3 3 3   Afraid - awful might happen 2 1 2 2   Total GAD 7 Score 17 19 18 19   Anxiety Difficulty  Very difficult Very difficult Extremely difficult    Social History[1] Patient did not mention any suicidal thoughts or ideation  during her visit. Feel the score on her PHQ was inaccurate. Will send a message to clarify.      Objective:   Physical Exam Vitals and nursing note reviewed.  Constitutional:      General: She is not in acute distress. Cardiovascular:     Rate and Rhythm: Normal rate and regular rhythm.  Pulmonary:     Effort: Pulmonary effort is normal.     Breath sounds: Normal breath sounds.  Neurological:     Mental Status: She is alert and oriented to person, place, and time.  Psychiatric:        Attention and Perception: Attention and perception normal.        Mood and Affect:  Mood and affect normal.        Speech: Speech normal.        Behavior: Behavior normal.        Thought Content: Thought content normal.        Judgment: Judgment normal.     Comments: Making good eye contact. Cheerful affect.     Today's Vitals   12/08/24 0851  BP: 114/78  Pulse: (!) 103  Temp: 98.2 F (36.8 C)  SpO2: 98%  Weight: 231 lb 9.6 oz (105.1 kg)  Height: 4' 11 (1.499 m)   Body mass index is 46.78 kg/m.         Assessment & Plan:  1. PCOS (polycystic ovarian syndrome) (Primary) PCOS contributing to weight gain and hormonal imbalances. Metformin  may help manage symptoms. - Prescribed low-dose metformin  500 mg daily.  - Comprehensive metabolic panel with GFR - Hemoglobin A1c - Lipid panel - metFORMIN  (GLUCOPHAGE ) 500 MG tablet; Take one po every day with dinner. If tolerated, increase to one po BID with meals.  Dispense: 60 tablet; Refill: 0  2. Adjustment disorder with mixed anxiety and depressed mood Extreme anxiety possibly linked to ADHD and stressors. Prozac  ineffective. Discussed CBT and Buspar. - Research and consider cognitive behavioral therapy (CBT) for anxiety management. - Consider Buspar as a potential medication option for anxiety. Given information on local therapist.   3. Adult ADHD Adderall preferred due to availability and effectiveness for ADHD  - amphetamine -dextroamphetamine (ADDERALL XR) 20 MG 24 hr capsule; Take 1 capsule (20 mg total) by mouth every morning.  Dispense: 30 capsule; Refill: 0 - amphetamine -dextroamphetamine (ADDERALL XR) 20 MG 24 hr capsule; Take 1 capsule (20 mg total) by mouth every morning.  Dispense: 30 capsule; Refill: 0 - amphetamine -dextroamphetamine (ADDERALL XR) 20 MG 24 hr capsule; Take 1 capsule (20 mg total) by mouth every morning.  Dispense: 30 capsule; Refill: 0  4. Morbid obesity (HCC) Concerns about prediabetes and PCOS. Discussed Contrave and metformin  for weight management. Encouraged healthy diet and  regular activity such as walking.  - Research Contrave and consider trial if affordable. - Ordered blood work to assess glucose levels and other relevant parameters. - Prescribed low-dose metformin  500 mg daily. - Comprehensive metabolic panel with GFR - Lipid panel - TSH + free T4  5. Fatigue, unspecified type  - CBC with Differential/Platelet - TSH + free T4  Defers need for STI screening.   Return in about 3 months (around 03/08/2025).         [1]  Social History Tobacco Use   Smoking status: Never   Smokeless tobacco: Never  Vaping Use   Vaping status: Never Used  Substance Use Topics   Alcohol use: Not Currently    Alcohol/week: 0.0 standard drinks of alcohol  Drug use: Not Currently   "

## 2024-12-09 LAB — COMPREHENSIVE METABOLIC PANEL WITH GFR
ALT: 15 IU/L (ref 0–32)
AST: 16 IU/L (ref 0–40)
Albumin: 4.3 g/dL (ref 4.0–5.0)
Alkaline Phosphatase: 117 IU/L — ABNORMAL HIGH (ref 41–116)
BUN/Creatinine Ratio: 14 (ref 9–23)
BUN: 9 mg/dL (ref 6–20)
Bilirubin Total: 0.3 mg/dL (ref 0.0–1.2)
CO2: 22 mmol/L (ref 20–29)
Calcium: 8.8 mg/dL (ref 8.7–10.2)
Chloride: 103 mmol/L (ref 96–106)
Creatinine, Ser: 0.66 mg/dL (ref 0.57–1.00)
Globulin, Total: 2.6 g/dL (ref 1.5–4.5)
Glucose: 84 mg/dL (ref 70–99)
Potassium: 4.2 mmol/L (ref 3.5–5.2)
Sodium: 139 mmol/L (ref 134–144)
Total Protein: 6.9 g/dL (ref 6.0–8.5)
eGFR: 126 mL/min/1.73

## 2024-12-09 LAB — CBC WITH DIFFERENTIAL/PLATELET
Basophils Absolute: 0.1 x10E3/uL (ref 0.0–0.2)
Basos: 1 %
EOS (ABSOLUTE): 0.2 x10E3/uL (ref 0.0–0.4)
Eos: 2 %
Hematocrit: 40.9 % (ref 34.0–46.6)
Hemoglobin: 12.8 g/dL (ref 11.1–15.9)
Immature Grans (Abs): 0 x10E3/uL (ref 0.0–0.1)
Immature Granulocytes: 0 %
Lymphocytes Absolute: 2.1 x10E3/uL (ref 0.7–3.1)
Lymphs: 27 %
MCH: 25.3 pg — ABNORMAL LOW (ref 26.6–33.0)
MCHC: 31.3 g/dL — ABNORMAL LOW (ref 31.5–35.7)
MCV: 81 fL (ref 79–97)
Monocytes Absolute: 0.6 x10E3/uL (ref 0.1–0.9)
Monocytes: 8 %
Neutrophils Absolute: 4.8 x10E3/uL (ref 1.4–7.0)
Neutrophils: 62 %
Platelets: 229 x10E3/uL (ref 150–450)
RBC: 5.06 x10E6/uL (ref 3.77–5.28)
RDW: 13.1 % (ref 11.7–15.4)
WBC: 7.7 x10E3/uL (ref 3.4–10.8)

## 2024-12-09 LAB — LIPID PANEL
Chol/HDL Ratio: 2.9 ratio (ref 0.0–4.4)
Cholesterol, Total: 144 mg/dL (ref 100–199)
HDL: 50 mg/dL
LDL Chol Calc (NIH): 79 mg/dL (ref 0–99)
Triglycerides: 79 mg/dL (ref 0–149)
VLDL Cholesterol Cal: 15 mg/dL (ref 5–40)

## 2024-12-09 LAB — TSH+FREE T4
Free T4: 1.04 ng/dL (ref 0.82–1.77)
TSH: 2.57 u[IU]/mL (ref 0.450–4.500)

## 2024-12-09 LAB — HEMOGLOBIN A1C
Est. average glucose Bld gHb Est-mCnc: 100 mg/dL
Hgb A1c MFr Bld: 5.1 % (ref 4.8–5.6)

## 2024-12-12 ENCOUNTER — Ambulatory Visit: Payer: Self-pay | Admitting: Nurse Practitioner

## 2024-12-22 ENCOUNTER — Ambulatory Visit: Admitting: Nurse Practitioner

## 2025-03-19 ENCOUNTER — Ambulatory Visit: Admitting: Nurse Practitioner
